# Patient Record
Sex: Male | Born: 1970
Health system: Southern US, Community
[De-identification: ages and names within clinical notes are randomized; demographics above are authoritative.]

## PROBLEM LIST (undated history)

## (undated) DIAGNOSIS — I499 Cardiac arrhythmia, unspecified: Secondary | ICD-10-CM

## (undated) DIAGNOSIS — R011 Cardiac murmur, unspecified: Secondary | ICD-10-CM

## (undated) DIAGNOSIS — J45990 Exercise induced bronchospasm: Secondary | ICD-10-CM

## (undated) DIAGNOSIS — I340 Nonrheumatic mitral (valve) insufficiency: Secondary | ICD-10-CM

## (undated) DIAGNOSIS — E785 Hyperlipidemia, unspecified: Secondary | ICD-10-CM

## (undated) DIAGNOSIS — I341 Nonrheumatic mitral (valve) prolapse: Secondary | ICD-10-CM

## (undated) DIAGNOSIS — T7840XA Allergy, unspecified, initial encounter: Secondary | ICD-10-CM

## (undated) HISTORY — DX: Allergy, unspecified, initial encounter: T78.40XA

## (undated) HISTORY — DX: Cardiac murmur, unspecified: R01.1

## (undated) HISTORY — PX: VASECTOMY: SHX75

## (undated) HISTORY — PX: CARDIAC VALVE REPLACEMENT: SHX585

## (undated) HISTORY — PX: POLYPECTOMY: SHX149

## (undated) HISTORY — DX: Nonrheumatic mitral (valve) insufficiency: I34.0

## (undated) HISTORY — PX: COLONOSCOPY: SHX174

## (undated) HISTORY — DX: Nonrheumatic mitral (valve) prolapse: I34.1

## (undated) HISTORY — DX: Cardiac arrhythmia, unspecified: I49.9

## (undated) HISTORY — DX: Hyperlipidemia, unspecified: E78.5

---

## 2019-07-14 ENCOUNTER — Encounter: Payer: Self-pay | Admitting: Registered Nurse

## 2019-07-14 ENCOUNTER — Encounter: Payer: Self-pay | Admitting: Gastroenterology

## 2019-07-14 ENCOUNTER — Other Ambulatory Visit: Payer: Self-pay

## 2019-07-14 ENCOUNTER — Ambulatory Visit (INDEPENDENT_AMBULATORY_CARE_PROVIDER_SITE_OTHER): Payer: BC Managed Care – PPO | Admitting: Registered Nurse

## 2019-07-14 VITALS — BP 153/88 | HR 67 | Temp 97.4°F | Ht 65.0 in | Wt 166.4 lb

## 2019-07-14 DIAGNOSIS — Z0001 Encounter for general adult medical examination with abnormal findings: Secondary | ICD-10-CM | POA: Diagnosis not present

## 2019-07-14 DIAGNOSIS — Z1211 Encounter for screening for malignant neoplasm of colon: Secondary | ICD-10-CM

## 2019-07-14 DIAGNOSIS — Z7689 Persons encountering health services in other specified circumstances: Secondary | ICD-10-CM

## 2019-07-14 DIAGNOSIS — Z130 Encounter for screening for diseases of the blood and blood-forming organs and certain disorders involving the immune mechanism: Secondary | ICD-10-CM

## 2019-07-14 DIAGNOSIS — Z23 Encounter for immunization: Secondary | ICD-10-CM | POA: Diagnosis not present

## 2019-07-14 DIAGNOSIS — Z13228 Encounter for screening for other metabolic disorders: Secondary | ICD-10-CM

## 2019-07-14 DIAGNOSIS — Z13220 Encounter for screening for lipoid disorders: Secondary | ICD-10-CM | POA: Diagnosis not present

## 2019-07-14 DIAGNOSIS — Z1329 Encounter for screening for other suspected endocrine disorder: Secondary | ICD-10-CM | POA: Diagnosis not present

## 2019-07-14 DIAGNOSIS — Z0000 Encounter for general adult medical examination without abnormal findings: Secondary | ICD-10-CM

## 2019-07-14 DIAGNOSIS — I341 Nonrheumatic mitral (valve) prolapse: Secondary | ICD-10-CM | POA: Diagnosis not present

## 2019-07-14 DIAGNOSIS — R062 Wheezing: Secondary | ICD-10-CM | POA: Diagnosis not present

## 2019-07-14 MED ORDER — ALBUTEROL SULFATE HFA 108 (90 BASE) MCG/ACT IN AERS: 2.0000 | INHALATION_SPRAY | Freq: Four times a day (QID) | RESPIRATORY_TRACT | 1 refills | Status: DC | PRN

## 2019-07-14 NOTE — Patient Instructions (Signed)
° ° ° °  If you have lab work done today you will be contacted with your lab results within the next 2 weeks.  If you have not heard from us then please contact us. The fastest way to get your results is to register for My Chart. ° ° °IF you received an x-ray today, you will receive an invoice from [facility]. Please contact [facility] at [phone] with questions or concerns regarding your invoice.  ° °IF you received labwork today, you will receive an invoice from [facility]. Please contact [facility] at [phone] with questions or concerns regarding your invoice.  ° °Our billing staff will not be able to assist you with questions regarding bills from these companies. ° °You will be contacted with the lab results as soon as they are available. The fastest way to get your results is to activate your My Chart account. Instructions are located on the last page of this paperwork. If you have not heard from us regarding the results in 2 weeks, please contact this office. °  ° ° ° °

## 2019-07-14 NOTE — Progress Notes (Signed)
New Patient Office Visit  Subjective:  Patient ID: Kevin Medina, male    DOB: 14-Nov-1970  Age: 49 y.o. MRN: [id number]  CC:  Chief Complaint  Patient presents with  . New Patient (Initial Visit)    physical    HPI Kevin Medina presents for CPE and labs  States that he had COVID in Oct 2020, for the most part symptoms resolved but he has been getting shob when warming up for work outs over the past week - relieved with rest. Notes that this normally does not happen to him. No LOC, no chest pain or difficulty breathing. Rest of workout goes fine.   Due for TDAP and colonoscopy.  Hx of mitral valve prolapse. No symptoms. Will refer to cards for assessment. Last checked 10-15 years ago  Past Medical History:  Diagnosis Date  . Allergy   . Arrhythmia   . Mitral valve prolapse     Past Surgical History:  Procedure Laterality Date  . VASECTOMY      Family History  Problem Relation Age of Onset  . Cancer Mother   . Healthy Mother   . Hyperlipidemia Father   . Hypertension Father   . Healthy Sister   . Cancer Maternal Grandmother   . Stroke Maternal Grandfather   . Hyperlipidemia Paternal Grandfather     Social History   Socioeconomic History  . Marital status: Unknown    Spouse name: Not on file  . Number of children: Not on file  . Years of education: Not on file  . Highest education level: Not on file  Occupational History  . Not on file  Tobacco Use  . Smoking status: Never Smoker  . Smokeless tobacco: Never Used  Substance and Sexual Activity  . Alcohol use: Yes    Alcohol/week: 10.0 standard drinks    Types: 10 Standard drinks or equivalent per week  . Drug use: Never  . Sexual activity: Yes  Other Topics Concern  . Not on file  Social History Narrative  . Not on file   Social Determinants of Health   Financial Resource Strain:   . Difficulty of Paying Living Expenses: Not on file  Food Insecurity:   . Worried About Charity fundraiser in  the Last Year: Not on file  . Ran Out of Food in the Last Year: Not on file  Transportation Needs:   . Lack of Transportation (Medical): Not on file  . Lack of Transportation (Non-Medical): Not on file  Physical Activity:   . Days of Exercise per Week: Not on file  . Minutes of Exercise per Session: Not on file  Stress:   . Feeling of Stress : Not on file  Social Connections:   . Frequency of Communication with Friends and Family: Not on file  . Frequency of Social Gatherings with Friends and Family: Not on file  . Attends Religious Services: Not on file  . Active Member of Clubs or Organizations: Not on file  . Attends Archivist Meetings: Not on file  . Marital Status: Not on file  Intimate Partner Violence:   . Fear of Current or Ex-Partner: Not on file  . Emotionally Abused: Not on file  . Physically Abused: Not on file  . Sexually Abused: Not on file    ROS Review of Systems  Constitutional: Negative.   HENT: Negative.   Eyes: Negative.   Respiratory: Negative.   Cardiovascular: Negative.   Gastrointestinal: Negative.   Endocrine: Negative.  Genitourinary: Negative.   Musculoskeletal: Negative.   Skin: Negative.   Allergic/Immunologic: Negative.   Neurological: Negative.   Hematological: Negative.   Psychiatric/Behavioral: Negative.   All other systems reviewed and are negative.   Objective:   Today's Vitals: BP (!) 153/88   Pulse 67   Temp (!) 97.4 F (36.3 C) (Temporal)   Ht 5\' 5"  (1.651 m)   Wt 166 lb 6.4 oz (75.5 kg)   SpO2 99%   BMI 27.69 kg/m   Physical Exam Vitals and nursing note reviewed.  Constitutional:      General: He is not in acute distress.    Appearance: Normal appearance. He is normal weight. He is not ill-appearing, toxic-appearing or diaphoretic.  HENT:     Head: Normocephalic and atraumatic.     Right Ear: Tympanic membrane, ear canal and external ear normal. There is no impacted cerumen.     Left Ear: Tympanic  membrane, ear canal and external ear normal. There is no impacted cerumen.     Nose: Nose normal. No congestion or rhinorrhea.     Mouth/Throat:     Mouth: Mucous membranes are moist.     Pharynx: Oropharynx is clear. No oropharyngeal exudate or posterior oropharyngeal erythema.  Eyes:     General: No scleral icterus.       Right eye: No discharge.        Left eye: No discharge.     Extraocular Movements: Extraocular movements intact.     Conjunctiva/sclera: Conjunctivae normal.     Pupils: Pupils are equal, round, and reactive to light.  Neck:     Vascular: No carotid bruit.  Cardiovascular:     Rate and Rhythm: Normal rate and regular rhythm.     Pulses: Normal pulses.     Heart sounds: Murmur present. No friction rub. No gallop.   Pulmonary:     Effort: Pulmonary effort is normal. No respiratory distress.     Breath sounds: No stridor. Wheezing (mild, R side) present. No rhonchi or rales.  Chest:     Chest wall: No tenderness.  Abdominal:     General: Abdomen is flat. Bowel sounds are normal. There is no distension.     Palpations: Abdomen is soft. There is no mass.     Tenderness: There is no abdominal tenderness. There is no right CVA tenderness, left CVA tenderness, guarding or rebound.     Hernia: No hernia is present.  Musculoskeletal:        General: No swelling, tenderness, deformity or signs of injury. Normal range of motion.     Cervical back: Normal range of motion and neck supple. No rigidity or tenderness.     Right lower leg: No edema.     Left lower leg: No edema.  Lymphadenopathy:     Cervical: No cervical adenopathy.  Skin:    General: Skin is warm and dry.     Capillary Refill: Capillary refill takes less than 2 seconds.     Coloration: Skin is not jaundiced or pale.     Findings: No bruising, erythema, lesion or rash.  Neurological:     General: No focal deficit present.     Mental Status: He is alert and oriented to person, place, and time. Mental  status is at baseline.     Cranial Nerves: No cranial nerve deficit.     Sensory: No sensory deficit.     Motor: No weakness.     Coordination: Coordination normal.  Gait: Gait normal.     Deep Tendon Reflexes: Reflexes normal.  Psychiatric:        Mood and Affect: Mood normal.        Behavior: Behavior normal.        Thought Content: Thought content normal.        Judgment: Judgment normal.     Assessment & Plan:   Problem List Items Addressed This Visit    None    Visit Diagnoses    Screening for colon cancer    -  Primary   Relevant Orders   Ambulatory referral to Gastroenterology   Screening for endocrine, metabolic and immunity disorder       Relevant Orders   CBC   Comprehensive metabolic panel   TSH   Hemoglobin A1c   Lipid screening       Relevant Orders   Lipid Panel   Need for diphtheria-tetanus-pertussis (Tdap) vaccine       Relevant Orders   Tdap vaccine greater than or equal to 7yo IM (Completed)      No outpatient encounter medications on file as of 07/14/2019.   No facility-administered encounter medications on file as of 07/14/2019.    Follow-up: No follow-ups on file.   PLAN  Refer to cardiology  Refer for colonoscopy  Labs drawn, will follow up as warranted  Mitral valve prolapse murmur notable on exam, likely grade 2 or 3, no palpable thrill.   Mild wheezing throughout middle and lower lobes of R lung - improved with deep breathing. Will give albuterol for PRN use, before workouts, etc  Otherwise normal findings on exam. Endorses healthy lifestyle  Patient encouraged to call clinic with any questions, comments, or concerns.  Maximiano Coss, NP

## 2019-07-15 LAB — COMPREHENSIVE METABOLIC PANEL
ALT: 36 IU/L (ref 0–44)
AST: 34 IU/L (ref 0–40)
Albumin/Globulin Ratio: 2.1 (ref 1.2–2.2)
Albumin: 4.6 g/dL (ref 4.0–5.0)
Alkaline Phosphatase: 50 IU/L (ref 39–117)
BUN/Creatinine Ratio: 11 (ref 9–20)
BUN: 15 mg/dL (ref 6–24)
Bilirubin Total: 0.8 mg/dL (ref 0.0–1.2)
CO2: 26 mmol/L (ref 20–29)
Calcium: 9.8 mg/dL (ref 8.7–10.2)
Chloride: 101 mmol/L (ref 96–106)
Creatinine, Ser: 1.34 mg/dL — ABNORMAL HIGH (ref 0.76–1.27)
GFR calc Af Amer: 71 mL/min/{1.73_m2} (ref >59)
GFR calc non Af Amer: 61 mL/min/{1.73_m2} (ref >59)
Globulin, Total: 2.2 g/dL (ref 1.5–4.5)
Glucose: 79 mg/dL (ref 65–99)
Potassium: 4 mmol/L (ref 3.5–5.2)
Sodium: 142 mmol/L (ref 134–144)
Total Protein: 6.8 g/dL (ref 6.0–8.5)

## 2019-07-15 LAB — CBC
Hematocrit: 46.4 % (ref 37.5–51.0)
Hemoglobin: 15.4 g/dL (ref 13.0–17.7)
MCH: 30.9 pg (ref 26.6–33.0)
MCHC: 33.2 g/dL (ref 31.5–35.7)
MCV: 93 fL (ref 79–97)
Platelets: 255 10*3/uL (ref 150–450)
RBC: 4.98 x10E6/uL (ref 4.14–5.80)
RDW: 12.9 % (ref 11.6–15.4)
WBC: 5.2 10*3/uL (ref 3.4–10.8)

## 2019-07-15 LAB — TSH: TSH: 3.5 u[IU]/mL (ref 0.450–4.500)

## 2019-07-15 LAB — LIPID PANEL
Chol/HDL Ratio: 3.8 ratio (ref 0.0–5.0)
Cholesterol, Total: 245 mg/dL — ABNORMAL HIGH (ref 100–199)
HDL: 65 mg/dL (ref >39)
LDL Chol Calc (NIH): 170 mg/dL — ABNORMAL HIGH (ref 0–99)
Triglycerides: 60 mg/dL (ref 0–149)
VLDL Cholesterol Cal: 10 mg/dL (ref 5–40)

## 2019-07-15 LAB — HEMOGLOBIN A1C
Est. average glucose Bld gHb Est-mCnc: 103 mg/dL
Hgb A1c MFr Bld: 5.2 % (ref 4.8–5.6)

## 2019-07-17 ENCOUNTER — Encounter: Payer: Self-pay | Admitting: Registered Nurse

## 2019-07-17 ENCOUNTER — Telehealth: Payer: Self-pay | Admitting: Registered Nurse

## 2019-07-17 ENCOUNTER — Other Ambulatory Visit: Payer: Self-pay | Admitting: Registered Nurse

## 2019-07-17 DIAGNOSIS — E785 Hyperlipidemia, unspecified: Secondary | ICD-10-CM

## 2019-07-17 MED ORDER — ATORVASTATIN CALCIUM 40 MG PO TABS: 40.0000 mg | ORAL_TABLET | Freq: Every day | ORAL | 3 refills | Status: DC

## 2019-07-17 NOTE — Progress Notes (Signed)
LDL and total cholesterol elevated Will start atorvastatin 40mg  PO qd with dinner. Called patient lvm asking him to return call. Ok to discuss these results with patient Will recheck labs at next physical. Follow up with cardiology and GI as we discussed Letter mailed to patient  Kathrin Ruddy, NP

## 2019-07-17 NOTE — Progress Notes (Signed)
Called pt, lvm asking to return call at 1250 on 07/17/19.  If we could try him again tomorrow, that would be great. His cholesterol is high and I want him to start on atorvastatin 40mg  with dinner each night. There are details in a letter that is posted to his chart - it has been mailed to him as well.  Thank you,  Kathrin Ruddy, NP

## 2019-07-17 NOTE — Telephone Encounter (Signed)
Left a msg on machine to give [place] a return call on which results he is referring to

## 2019-07-17 NOTE — Telephone Encounter (Signed)
Pt called and would like a callback in regards to his results . Not sure pt did not disclose which results

## 2019-07-18 NOTE — Telephone Encounter (Signed)
Patient

## 2019-07-18 NOTE — Telephone Encounter (Signed)
Patient is calling back to receive his cholesterol results. Patient is unable to return calls during office hours. Patient will activate his MyChart and would like to know if his results could be placed in his mychart account. Please advise Cb- [phone]

## 2019-07-18 NOTE — Telephone Encounter (Signed)
Left another msg for patient to return call. If is ok to leave results on this phone number then let us know when return call and we will be able to leave results at that time. Results can also be found on his my chart

## 2019-07-20 ENCOUNTER — Encounter: Payer: Self-pay | Admitting: Radiology

## 2019-07-21 ENCOUNTER — Other Ambulatory Visit: Payer: Self-pay

## 2019-07-21 ENCOUNTER — Ambulatory Visit (INDEPENDENT_AMBULATORY_CARE_PROVIDER_SITE_OTHER): Payer: BC Managed Care – PPO | Admitting: Cardiovascular Disease

## 2019-07-21 ENCOUNTER — Encounter: Payer: Self-pay | Admitting: Cardiovascular Disease

## 2019-07-21 VITALS — BP 130/78 | HR 68 | Temp 97.9°F | Ht 65.0 in | Wt 172.2 lb

## 2019-07-21 DIAGNOSIS — I341 Nonrheumatic mitral (valve) prolapse: Secondary | ICD-10-CM | POA: Diagnosis not present

## 2019-07-21 NOTE — Progress Notes (Signed)
07/21/2019 Kevin Medina   04-27-1971  [id number]  Primary Physician Maximiano Coss, NP Primary Cardiologist: Lorretta Harp MD Renae Gloss  HPI:  Kevin Medina is a 49 y.o. thin and fit appearing married Caucasian male father of one 61 year old daughter who works as an Systems developer.  He was referred by Maximiano Coss, NP for evaluation of dyspnea and mitral valve prolapse.  He basically has no cardiac risk factors.  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  He did have a 2D echo and GXT back in [place] 15 years ago and was told he had mitral valve prolapse.  He was a Wellsite geologist at the time.  He contracted COVID-19 in late October with fever for a day and a half, body aches, headaches and loss of taste and smell for 6 days but recovered.  He is noticed recently when he works out that he is more fatigued and shortness of breath than he usually is.  His PCP noticed a late systolic click and murmur.   Current Meds  Medication Sig  . albuterol (VENTOLIN HFA) 108 (90 Base) MCG/ACT inhaler Inhale 2 puffs into the lungs every 6 (six) hours as needed for wheezing or shortness of breath.  . [DISCONTINUED] atorvastatin (LIPITOR) 40 MG tablet Take 1 tablet (40 mg total) by mouth daily.     No Known Allergies  Social History   Socioeconomic History  . Marital status: Married    Spouse name: Not on file  . Number of children: 1  . Years of education: Not on file  . Highest education level: Not on file  Occupational History  . Not on file  Tobacco Use  . Smoking status: Never Smoker  . Smokeless tobacco: Never Used  Substance and Sexual Activity  . Alcohol use: Yes    Alcohol/week: 10.0 standard drinks    Types: 10 Standard drinks or equivalent per week  . Drug use: Never  . Sexual activity: Yes  Other Topics Concern  . Not on file  Social History Narrative  . Not on file   Social Determinants of Health   Financial Resource Strain:   . Difficulty of  Paying Living Expenses: Not on file  Food Insecurity:   . Worried About Charity fundraiser in the Last Year: Not on file  . Ran Out of Food in the Last Year: Not on file  Transportation Needs:   . Lack of Transportation (Medical): Not on file  . Lack of Transportation (Non-Medical): Not on file  Physical Activity:   . Days of Exercise per Week: Not on file  . Minutes of Exercise per Session: Not on file  Stress:   . Feeling of Stress : Not on file  Social Connections:   . Frequency of Communication with Friends and Family: Not on file  . Frequency of Social Gatherings with Friends and Family: Not on file  . Attends Religious Services: Not on file  . Active Member of Clubs or Organizations: Not on file  . Attends Archivist Meetings: Not on file  . Marital Status: Not on file  Intimate Partner Violence:   . Fear of Current or Ex-Partner: Not on file  . Emotionally Abused: Not on file  . Physically Abused: Not on file  . Sexually Abused: Not on file     Review of Systems: General: negative for chills, fever, night sweats or weight changes.  Cardiovascular: negative for chest pain, dyspnea on  exertion, edema, orthopnea, palpitations, paroxysmal nocturnal dyspnea or shortness of breath Dermatological: negative for rash Respiratory: negative for cough or wheezing Urologic: negative for hematuria Abdominal: negative for nausea, vomiting, diarrhea, bright red blood per rectum, melena, or hematemesis Neurologic: negative for visual changes, syncope, or dizziness All other systems reviewed and are otherwise negative except as noted above.    Blood pressure 130/78, pulse 68, temperature 97.9 F (36.6 C), height 5\' 5"  (1.651 m), weight 172 lb 3.2 oz (78.1 kg), SpO2 99 %.  General appearance: alert and no distress Neck: no adenopathy, no carotid bruit, no JVD, supple, symmetrical, trachea midline and thyroid not enlarged, symmetric, no tenderness/mass/nodules Lungs: clear to  auscultation bilaterally Heart: Late systolic click and apical systolic murmur consistent with mitral valve prolapse Extremities: extremities normal, atraumatic, no cyanosis or edema Pulses: 2+ and symmetric Skin: Skin color, texture, turgor normal. No rashes or lesions Neurologic: Alert and oriented X 3, normal strength and tone. Normal symmetric reflexes. Normal coordination and gait  EKG sinus rhythm at 73 without ST or T wave changes.  I personally reviewed this EKG.  ASSESSMENT AND PLAN:   Mitral valve prolapse History of mitral valve prolapse but to 2D echo 15 years ago.  He does have a late systolic click and a soft apical systolic murmur on exam.  He also complains of fairly recent onset dyspnea.  He is placed on albuterol with mild benefit.  Ankle negative 2D echo to further evaluate.      Lorretta Harp MD FACP,FACC,FAHA, [facility] 07/21/2019 11:23 AM

## 2019-07-21 NOTE — Assessment & Plan Note (Signed)
History of mitral valve prolapse but to 2D echo 15 years ago.  He does have a late systolic click and a soft apical systolic murmur on exam.  He also complains of fairly recent onset dyspnea.  He is placed on albuterol with mild benefit.  Ankle negative 2D echo to further evaluate.

## 2019-07-21 NOTE — Patient Instructions (Signed)
Medication Instructions:  Your physician recommends that you continue on your current medications as directed. Please refer to the Current Medication list given to you today.  If you need a refill on your cardiac medications before your next appointment, please call your pharmacy.   Lab work: NONE  Testing/Procedures: Your physician has requested that you have an echocardiogram. Echocardiography is a painless test that uses sound waves to create images of your heart. It provides your doctor with information about the size and shape of your heart and how well your heart's chambers and valves are working. This procedure takes approximately one hour. There are no restrictions for this procedure. [address]  Follow-Up: At [facility], you and your health needs are our priority.  As part of our continuing mission to provide you with exceptional heart care, we have created designated Provider Care Teams.  These Care Teams include your primary Cardiologist (physician) and Advanced Practice Providers (APPs -  Physician Assistants and Nurse Practitioners) who all work together to provide you with the care you need, when you need it. You may see Dr. Berry or one of the following Advanced Practice Providers on your designated Care Team:    Luke Kilroy, PA-C  Callie Goodrich, PA-C  Jesse Cleaver, FNP  Your physician wants you to follow-up as needed   

## 2019-08-04 ENCOUNTER — Ambulatory Visit (AMBULATORY_SURGERY_CENTER): Payer: BC Managed Care – PPO | Admitting: *Deleted

## 2019-08-04 ENCOUNTER — Other Ambulatory Visit: Payer: Self-pay

## 2019-08-04 ENCOUNTER — Telehealth: Payer: Self-pay | Admitting: *Deleted

## 2019-08-04 VITALS — Temp 98.2°F | Ht 65.0 in | Wt 168.0 lb

## 2019-08-04 DIAGNOSIS — Z1211 Encounter for screening for malignant neoplasm of colon: Secondary | ICD-10-CM

## 2019-08-04 DIAGNOSIS — Z01818 Encounter for other preprocedural examination: Secondary | ICD-10-CM

## 2019-08-04 NOTE — Telephone Encounter (Signed)
Kevin Medina, Just read the cardiology note by Dr. Gwenlyn Found and since he ordered it I would recommend that we complete the echocardiogram and have him scheduled after.  Lets just make sure everything is okay.  That would be my recommendation.  We should be able to get him in pretty soon thereafter.  We just want to do it safe for him. Thanks. GM

## 2019-08-04 NOTE — Telephone Encounter (Signed)
Called pt to give recommendation per Dr  Rush Landmark - [facility]

## 2019-08-04 NOTE — Progress Notes (Signed)
No egg or soy allergy known to patient  No issues with past sedation with any surgeries  or procedures, no  Past intubation  No diet pills per patient No home 02 use per patient  No blood thinners per patient  Pt denies issues with constipation  No A fib or A flutter  EMMI video sent to pt's e mail   Due to the COVID-19 pandemic we are asking patients to follow these guidelines. Please only bring one care partner. Please be aware that your care partner may wait in the car in the parking lot or if they feel like they will be too hot to wait in the car, they may wait in the lobby on the 4th floor. All care partners are required to wear a mask the entire time (we do not have any that we can provide them), they need to practice social distancing, and we will do a Covid check for all patient's and care partners when you arrive. Also we will check their temperature and your temperature. If the care partner waits in their car they need to stay in the parking lot the entire time and we will call them on their cell phone when the patient is ready for discharge so they can bring the car to the front of the building. Also all patient's will need to wear a mask into building.

## 2019-08-04 NOTE — Telephone Encounter (Signed)
Marie,  This pt is cleared for anesthetic care at [facility].  Thanks,  Eberardo Demello 

## 2019-08-04 NOTE — Telephone Encounter (Signed)
Dr Rush Landmark and Osvaldo Angst,  I saw this pt in [place] this morning- he informed me he has an Echo scheduled for 3-19 for a MVP- he has a colon scheduled for 08-18-2019, screening. He also told me about 15 yrs ago he had a colonoscopy and he had polyps- he is unaware of type and we have no records- he also said he had an Echo about the same time that was normal.  He saw Cardio 07-21-2019.   He is very healthy, works out about 6 days a week- he had covid 03-2019 and now has some dyspnea with working out as well  Should he RS his colon until after his Echo, or is he okay to proceed?  Thanks for your time, Marijean Niemann

## 2019-08-07 NOTE — Telephone Encounter (Signed)
Spoke to pt- informed him of Dr Donneta Romberg recommendation- canceled 3-5 colon, 3-2 cov test- ot will CB to RS colon after 3-19 echo and once cleared by cardiology

## 2019-08-07 NOTE — Telephone Encounter (Signed)
LM to [facility] to me in [place]

## 2019-08-18 ENCOUNTER — Other Ambulatory Visit (HOSPITAL_COMMUNITY): Payer: BC Managed Care – PPO

## 2019-08-18 ENCOUNTER — Encounter: Payer: BC Managed Care – PPO | Admitting: Gastroenterology

## 2019-09-01 ENCOUNTER — Encounter (INDEPENDENT_AMBULATORY_CARE_PROVIDER_SITE_OTHER): Payer: Self-pay

## 2019-09-01 ENCOUNTER — Other Ambulatory Visit: Payer: Self-pay

## 2019-09-01 ENCOUNTER — Ambulatory Visit (HOSPITAL_COMMUNITY): Payer: BC Managed Care – PPO | Attending: Internal Medicine

## 2019-09-01 DIAGNOSIS — I341 Nonrheumatic mitral (valve) prolapse: Secondary | ICD-10-CM | POA: Diagnosis not present

## 2019-09-05 ENCOUNTER — Other Ambulatory Visit: Payer: Self-pay

## 2019-09-05 DIAGNOSIS — U071 COVID-19: Secondary | ICD-10-CM

## 2019-09-05 DIAGNOSIS — R0600 Dyspnea, unspecified: Secondary | ICD-10-CM

## 2019-09-05 DIAGNOSIS — I341 Nonrheumatic mitral (valve) prolapse: Secondary | ICD-10-CM

## 2019-09-05 DIAGNOSIS — Z87891 Personal history of nicotine dependence: Secondary | ICD-10-CM

## 2019-09-20 ENCOUNTER — Encounter: Payer: Self-pay | Admitting: *Deleted

## 2019-09-20 ENCOUNTER — Encounter: Payer: Self-pay | Admitting: Cardiovascular Disease

## 2019-09-20 ENCOUNTER — Other Ambulatory Visit: Payer: Self-pay

## 2019-09-20 ENCOUNTER — Ambulatory Visit (INDEPENDENT_AMBULATORY_CARE_PROVIDER_SITE_OTHER): Payer: BC Managed Care – PPO | Admitting: Cardiovascular Disease

## 2019-09-20 VITALS — BP 136/86 | HR 63 | Ht 65.0 in | Wt 167.8 lb

## 2019-09-20 DIAGNOSIS — Z01812 Encounter for preprocedural laboratory examination: Secondary | ICD-10-CM

## 2019-09-20 DIAGNOSIS — I341 Nonrheumatic mitral (valve) prolapse: Secondary | ICD-10-CM

## 2019-09-20 NOTE — Patient Instructions (Addendum)
Your physician recommends that you continue on your current medications as directed. Please refer to the Current Medication list given to you today.   Your physician recommends that you return for lab work in: BMET CBC INR PRIOR TO CATH    Your physician has requested that you have a TEE. During a TEE, sound waves are used to create images of your heart. It provides your doctor with information about the size and shape of your heart and how well your heart's chambers and valves are working. In this test, a transducer is attached to the end of a flexible tube that's guided down your throat and into your esophagus (the tube leading from you mouth to your stomach) to get a more detailed image of your heart. You are not awake for the procedure. Please see the instruction sheet given to you today. For further information please visit [email or web address].     [facility] [place] [address] Dept: [phone] Loc: [phone]  Kevin Medina  09/20/2019  You are scheduled for a Cardiac Catheterization on Thursday, April 22 with Dr. Quay Burow.  1. Please arrive at the [facility] (Main Entrance A) at [facility]: [address] [place], [place] [address] at 9:30 AM (This time is two hours before your procedure to ensure your preparation). Free valet parking service is available.   Special note: Every effort is made to have your procedure done on time. Please understand that emergencies sometimes delay scheduled procedures.  2. Diet: Do not eat solid foods after midnight.  The patient may have clear liquids until 5am upon the day of the procedure.  3. Labs: You will need to have blood drawn on Tuesday, April 20 at [place]  Open: [place] (Lunch 12:30 - 1:30)   Phone: [phone]. You do not need to be fasting.  4. Medication instructions in  preparation for your procedure:   Contrast Allergy: No     On the morning of your procedure, take your Aspirin and any morning medicines NOT listed above.  You may use sips of water.  5. Plan for one night stay--bring personal belongings. 6. Bring a current list of your medications and current insurance cards. 7. You MUST have a responsible person to drive you home. 8. Someone MUST be with you the first 24 hours after you arrive home or your discharge will be delayed. 9. Please wear clothes that are easy to get on and off and wear slip-on shoes.  Thank you for allowing [place] to care for you!   -- [facility]

## 2019-09-20 NOTE — H&P (View-Only) (Signed)
09/20/2019 Kevin Medina   Oct 09, 1970  [id number]  Primary Physician Maximiano Coss, NP Primary Cardiologist: Lorretta Harp MD Renae Gloss  HPI:  Kevin Medina is a 49 y.o.  thin and fit appearing married Caucasian male father of one 4 year old daughter who works as an Systems developer.  He was referred by Maximiano Coss, NP for evaluation of dyspnea and mitral valve prolapse.  I last saw him in the office 07/21/2019. He basically has no cardiac risk factors.  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  He did have a 2D echo and GXT back in [place] 15 years ago and was told he had mitral valve prolapse.  He was a Wellsite geologist at the time.  He contracted COVID-19 in late October with fever for a day and a half, body aches, headaches and loss of taste and smell for 6 days but recovered.  He is noticed recently when he works out that he is more fatigued and shortness of breath than he usually is.  His PCP noticed a late systolic click and murmur.  Since I saw him last he has had a 2D echo performed 09/01/2019 revealed an EF of 40% with normal LV size.  He has grade 2 diastolic dysfunction, bilateral mitral valve prolapse with moderate to severe mitral regurgitation.  He is functionally limited and has noticed increasing shortness of breath on exertion.  He will need right left heart cath, transesophageal echo and referral to Dr. Roxy Manns for consideration of minimally invasive mitral valve repair.    Current Meds  Medication Sig  . albuterol (VENTOLIN HFA) 108 (90 Base) MCG/ACT inhaler Inhale 2 puffs into the lungs every 6 (six) hours as needed for wheezing or shortness of breath.  Marland Kitchen CREATINE PO Take by mouth.  . DIGESTIVE ENZYMES PO Take by mouth.  . FIBER DIET PO Take by mouth.  . Multiple Vitamin (MULTIVITAMIN) tablet Take 1 tablet by mouth daily.  . OMEGA-3 FATTY ACIDS PO Take by mouth.  . TURMERIC PO Take by mouth.     No Known Allergies  Social History    Socioeconomic History  . Marital status: Married    Spouse name: Not on file  . Number of children: 1  . Years of education: Not on file  . Highest education level: Not on file  Occupational History  . Not on file  Tobacco Use  . Smoking status: Former Research scientist (life sciences)  . Smokeless tobacco: Former Network engineer and Sexual Activity  . Alcohol use: Yes    Alcohol/week: 10.0 standard drinks    Types: 10 Standard drinks or equivalent per week    Comment: social- none since jan 08-2019  . Drug use: Never  . Sexual activity: Yes  Other Topics Concern  . Not on file  Social History Narrative  . Not on file   Social Determinants of Health   Financial Resource Strain:   . Difficulty of Paying Living Expenses:   Food Insecurity:   . Worried About Charity fundraiser in the Last Year:   . Arboriculturist in the Last Year:   Transportation Needs:   . Film/video editor (Medical):   Marland Kitchen Lack of Transportation (Non-Medical):   Physical Activity:   . Days of Exercise per Week:   . Minutes of Exercise per Session:   Stress:   . Feeling of Stress :   Social Connections:   . Frequency of Communication with Friends and  Family:   . Frequency of Social Gatherings with Friends and Family:   . Attends Religious Services:   . Active Member of Clubs or Organizations:   . Attends Archivist Meetings:   Marland Kitchen Marital Status:   Intimate Partner Violence:   . Fear of Current or Ex-Partner:   . Emotionally Abused:   Marland Kitchen Physically Abused:   . Sexually Abused:      Review of Systems: General: negative for chills, fever, night sweats or weight changes.  Cardiovascular: negative for chest pain, dyspnea on exertion, edema, orthopnea, palpitations, paroxysmal nocturnal dyspnea or shortness of breath Dermatological: negative for rash Respiratory: negative for cough or wheezing Urologic: negative for hematuria Abdominal: negative for nausea, vomiting, diarrhea, bright red blood per rectum,  melena, or hematemesis Neurologic: negative for visual changes, syncope, or dizziness All other systems reviewed and are otherwise negative except as noted above.    Blood pressure 136/86, pulse 63, height 5\' 5"  (1.651 m), weight 167 lb 12.8 oz (76.1 kg), SpO2 99 %.  General appearance: alert and no distress Neck: no adenopathy, no carotid bruit, no JVD, supple, symmetrical, trachea midline and thyroid not enlarged, symmetric, no tenderness/mass/nodules Lungs: clear to auscultation bilaterally Heart: Soft apical systolic murmur consistent with mitral regurgitation. Extremities: extremities normal, atraumatic, no cyanosis or edema Pulses: 2+ and symmetric Skin: Skin color, texture, turgor normal. No rashes or lesions Neurologic: Alert and oriented X 3, normal strength and tone. Normal symmetric reflexes. Normal coordination and gait  EKG not performed today  ASSESSMENT AND PLAN:   Mitral valve prolapse History of mitral valve prolapse dating back decades.  His last evaluation was 15 years ago.  He is very active and athletic.  He is noticed increasing shortness of breath.  A 2D echocardiogram performed 09/01/2019 showed EF of 40% with bileaflet prolapse and moderate MR.  I am going to schedule him for a TEE, right left heart cath (radial/brachial) after which I will refer him to Dr. Lilly Cove for surgical consultation for minimally invasive mitral valve repair.      Lorretta Harp MD FACP,FACC,FAHA, [facility] 09/20/2019 12:08 PM

## 2019-09-20 NOTE — Assessment & Plan Note (Signed)
History of mitral valve prolapse dating back decades.  His last evaluation was 15 years ago.  He is very active and athletic.  He is noticed increasing shortness of breath.  A 2D echocardiogram performed 09/01/2019 showed EF of 40% with bileaflet prolapse and moderate MR.  I am going to schedule him for a TEE, right left heart cath (radial/brachial) after which I will refer him to Dr. Lilly Cove for surgical consultation for minimally invasive mitral valve repair.

## 2019-09-20 NOTE — Progress Notes (Signed)
09/20/2019 Kevin Medina   May 02, 1971  [id number]  Primary Physician Maximiano Coss, NP Primary Cardiologist: Lorretta Harp MD Renae Gloss  HPI:  Kevin Medina is a 49 y.o.  thin and fit appearing married Caucasian male father of one 34 year old daughter who works as an Systems developer.  He was referred by Maximiano Coss, NP for evaluation of dyspnea and mitral valve prolapse.  I last saw him in the office 07/21/2019. He basically has no cardiac risk factors.  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  He did have a 2D echo and GXT back in [place] 15 years ago and was told he had mitral valve prolapse.  He was a Wellsite geologist at the time.  He contracted COVID-19 in late October with fever for a day and a half, body aches, headaches and loss of taste and smell for 6 days but recovered.  He is noticed recently when he works out that he is more fatigued and shortness of breath than he usually is.  His PCP noticed a late systolic click and murmur.  Since I saw him last he has had a 2D echo performed 09/01/2019 revealed an EF of 40% with normal LV size.  He has grade 2 diastolic dysfunction, bilateral mitral valve prolapse with moderate to severe mitral regurgitation.  He is functionally limited and has noticed increasing shortness of breath on exertion.  He will need right left heart cath, transesophageal echo and referral to Dr. Roxy Manns for consideration of minimally invasive mitral valve repair.    Current Meds  Medication Sig  . albuterol (VENTOLIN HFA) 108 (90 Base) MCG/ACT inhaler Inhale 2 puffs into the lungs every 6 (six) hours as needed for wheezing or shortness of breath.  Marland Kitchen CREATINE PO Take by mouth.  . DIGESTIVE ENZYMES PO Take by mouth.  . FIBER DIET PO Take by mouth.  . Multiple Vitamin (MULTIVITAMIN) tablet Take 1 tablet by mouth daily.  . OMEGA-3 FATTY ACIDS PO Take by mouth.  . TURMERIC PO Take by mouth.     No Known Allergies  Social History    Socioeconomic History  . Marital status: Married    Spouse name: Not on file  . Number of children: 1  . Years of education: Not on file  . Highest education level: Not on file  Occupational History  . Not on file  Tobacco Use  . Smoking status: Former Research scientist (life sciences)  . Smokeless tobacco: Former Network engineer and Sexual Activity  . Alcohol use: Yes    Alcohol/week: 10.0 standard drinks    Types: 10 Standard drinks or equivalent per week    Comment: social- none since jan 08-2019  . Drug use: Never  . Sexual activity: Yes  Other Topics Concern  . Not on file  Social History Narrative  . Not on file   Social Determinants of Health   Financial Resource Strain:   . Difficulty of Paying Living Expenses:   Food Insecurity:   . Worried About Charity fundraiser in the Last Year:   . Arboriculturist in the Last Year:   Transportation Needs:   . Film/video editor (Medical):   Marland Kitchen Lack of Transportation (Non-Medical):   Physical Activity:   . Days of Exercise per Week:   . Minutes of Exercise per Session:   Stress:   . Feeling of Stress :   Social Connections:   . Frequency of Communication with Friends and  Family:   . Frequency of Social Gatherings with Friends and Family:   . Attends Religious Services:   . Active Member of Clubs or Organizations:   . Attends Archivist Meetings:   Marland Kitchen Marital Status:   Intimate Partner Violence:   . Fear of Current or Ex-Partner:   . Emotionally Abused:   Marland Kitchen Physically Abused:   . Sexually Abused:      Review of Systems: General: negative for chills, fever, night sweats or weight changes.  Cardiovascular: negative for chest pain, dyspnea on exertion, edema, orthopnea, palpitations, paroxysmal nocturnal dyspnea or shortness of breath Dermatological: negative for rash Respiratory: negative for cough or wheezing Urologic: negative for hematuria Abdominal: negative for nausea, vomiting, diarrhea, bright red blood per rectum,  melena, or hematemesis Neurologic: negative for visual changes, syncope, or dizziness All other systems reviewed and are otherwise negative except as noted above.    Blood pressure 136/86, pulse 63, height 5\' 5"  (1.651 m), weight 167 lb 12.8 oz (76.1 kg), SpO2 99 %.  General appearance: alert and no distress Neck: no adenopathy, no carotid bruit, no JVD, supple, symmetrical, trachea midline and thyroid not enlarged, symmetric, no tenderness/mass/nodules Lungs: clear to auscultation bilaterally Heart: Soft apical systolic murmur consistent with mitral regurgitation. Extremities: extremities normal, atraumatic, no cyanosis or edema Pulses: 2+ and symmetric Skin: Skin color, texture, turgor normal. No rashes or lesions Neurologic: Alert and oriented X 3, normal strength and tone. Normal symmetric reflexes. Normal coordination and gait  EKG not performed today  ASSESSMENT AND PLAN:   Mitral valve prolapse History of mitral valve prolapse dating back decades.  His last evaluation was 15 years ago.  He is very active and athletic.  He is noticed increasing shortness of breath.  A 2D echocardiogram performed 09/01/2019 showed EF of 40% with bileaflet prolapse and moderate MR.  I am going to schedule him for a TEE, right left heart cath (radial/brachial) after which I will refer him to Dr. Lilly Cove for surgical consultation for minimally invasive mitral valve repair.      Lorretta Harp MD FACP,FACC,FAHA, [facility] 09/20/2019 12:08 PM

## 2019-09-29 ENCOUNTER — Other Ambulatory Visit: Payer: Self-pay | Admitting: *Deleted

## 2019-09-29 DIAGNOSIS — I341 Nonrheumatic mitral (valve) prolapse: Secondary | ICD-10-CM

## 2019-09-29 MED ORDER — SODIUM CHLORIDE 0.9% FLUSH: 3.0000 mL | Freq: Two times a day (BID) | INTRAVENOUS | Status: DC

## 2019-10-02 ENCOUNTER — Telehealth: Payer: Self-pay | Admitting: *Deleted

## 2019-10-02 NOTE — Telephone Encounter (Signed)
Left detailed voicemail message, pt should arrive at [facility] Thursday October 05, 2019 8 AM (not 9:30 AM as previously instructed)-TEE 9 AM, R/LHC 11:30 AM.

## 2019-10-02 NOTE — Telephone Encounter (Signed)
Call placed to patient to advise Thursday October 05, 2019 arrival time at hospital for procedures is 8 AM not 9:30 AM as previously instructed--TEE 9 AM and R/LHC 11:30 AM.  LMTCB for patient to discuss arrival time 8 AM, change from previous instruction of 9:30 AM.

## 2019-10-03 ENCOUNTER — Other Ambulatory Visit (HOSPITAL_COMMUNITY)
Admission: RE | Admit: 2019-10-03 | Discharge: 2019-10-03 | Disposition: A | Discharge status: Home / Self Care | Payer: BC Managed Care – PPO | Source: Ambulatory Visit | Attending: Cardiology | Admitting: Cardiology

## 2019-10-03 ENCOUNTER — Other Ambulatory Visit: Payer: Self-pay

## 2019-10-03 DIAGNOSIS — Z01812 Encounter for preprocedural laboratory examination: Secondary | ICD-10-CM | POA: Diagnosis not present

## 2019-10-03 DIAGNOSIS — Z20822 Contact with and (suspected) exposure to covid-19: Secondary | ICD-10-CM | POA: Insufficient documentation

## 2019-10-04 ENCOUNTER — Other Ambulatory Visit: Payer: Self-pay | Admitting: *Deleted

## 2019-10-04 DIAGNOSIS — I341 Nonrheumatic mitral (valve) prolapse: Secondary | ICD-10-CM

## 2019-10-04 LAB — BASIC METABOLIC PANEL
BUN/Creatinine Ratio: 13 (ref 9–20)
BUN: 16 mg/dL (ref 6–24)
CO2: 25 mmol/L (ref 20–29)
Calcium: 9.4 mg/dL (ref 8.7–10.2)
Chloride: 98 mmol/L (ref 96–106)
Creatinine, Ser: 1.23 mg/dL (ref 0.76–1.27)
GFR calc Af Amer: 79 mL/min/{1.73_m2} (ref >59)
GFR calc non Af Amer: 68 mL/min/{1.73_m2} (ref >59)
Glucose: 74 mg/dL (ref 65–99)
Potassium: 4.7 mmol/L (ref 3.5–5.2)
Sodium: 138 mmol/L (ref 134–144)

## 2019-10-04 LAB — CBC
Hematocrit: 43.5 % (ref 37.5–51.0)
Hemoglobin: 15.1 g/dL (ref 13.0–17.7)
MCH: 31.8 pg (ref 26.6–33.0)
MCHC: 34.7 g/dL (ref 31.5–35.7)
MCV: 92 fL (ref 79–97)
Platelets: 234 10*3/uL (ref 150–450)
RBC: 4.75 x10E6/uL (ref 4.14–5.80)
RDW: 12.9 % (ref 11.6–15.4)
WBC: 5 10*3/uL (ref 3.4–10.8)

## 2019-10-04 LAB — SARS CORONAVIRUS 2 (TAT 6-24 HRS): SARS Coronavirus 2: NEGATIVE

## 2019-10-04 LAB — PROTIME-INR
INR: 1.1 (ref 0.9–1.2)
Prothrombin Time: 11.6 s (ref 9.1–12.0)

## 2019-10-05 ENCOUNTER — Encounter (HOSPITAL_COMMUNITY)
Admission: RE | Disposition: A | Discharge status: Home / Self Care | Payer: BC Managed Care – PPO | Source: Home / Self Care | Attending: Cardiology

## 2019-10-05 ENCOUNTER — Ambulatory Visit (HOSPITAL_COMMUNITY): Payer: BC Managed Care – PPO | Admitting: Certified Registered Nurse Anesthetist

## 2019-10-05 ENCOUNTER — Ambulatory Visit (HOSPITAL_COMMUNITY)
Admission: RE | Admit: 2019-10-05 | Discharge: 2019-10-05 | Disposition: A | Discharge status: Home / Self Care | Payer: BC Managed Care – PPO | Source: Ambulatory Visit | Attending: Cardiovascular Disease | Admitting: Cardiovascular Disease

## 2019-10-05 ENCOUNTER — Ambulatory Visit (HOSPITAL_COMMUNITY)
Admission: RE | Admit: 2019-10-05 | Discharge: 2019-10-05 | Disposition: A | Discharge status: Home / Self Care | Payer: BC Managed Care – PPO | Attending: Cardiology | Admitting: Cardiology

## 2019-10-05 ENCOUNTER — Ambulatory Visit (HOSPITAL_COMMUNITY)
Admission: RE | Admit: 2019-10-05 | Payer: BC Managed Care – PPO | Source: Home / Self Care | Admitting: Cardiovascular Disease

## 2019-10-05 ENCOUNTER — Ambulatory Visit (HOSPITAL_BASED_OUTPATIENT_CLINIC_OR_DEPARTMENT_OTHER): Payer: BC Managed Care – PPO

## 2019-10-05 ENCOUNTER — Other Ambulatory Visit: Payer: Self-pay

## 2019-10-05 ENCOUNTER — Encounter (HOSPITAL_COMMUNITY)
Admission: RE | Disposition: A | Discharge status: Home / Self Care | Payer: Self-pay | Source: Ambulatory Visit | Attending: Cardiovascular Disease

## 2019-10-05 ENCOUNTER — Encounter (HOSPITAL_COMMUNITY): Payer: Self-pay | Admitting: Cardiology

## 2019-10-05 DIAGNOSIS — I340 Nonrheumatic mitral (valve) insufficiency: Secondary | ICD-10-CM | POA: Insufficient documentation

## 2019-10-05 DIAGNOSIS — Z87891 Personal history of nicotine dependence: Secondary | ICD-10-CM | POA: Insufficient documentation

## 2019-10-05 DIAGNOSIS — R0600 Dyspnea, unspecified: Secondary | ICD-10-CM | POA: Insufficient documentation

## 2019-10-05 DIAGNOSIS — Z8616 Personal history of COVID-19: Secondary | ICD-10-CM | POA: Insufficient documentation

## 2019-10-05 DIAGNOSIS — I428 Other cardiomyopathies: Secondary | ICD-10-CM | POA: Diagnosis not present

## 2019-10-05 DIAGNOSIS — I341 Nonrheumatic mitral (valve) prolapse: Secondary | ICD-10-CM | POA: Diagnosis present

## 2019-10-05 DIAGNOSIS — E785 Hyperlipidemia, unspecified: Secondary | ICD-10-CM | POA: Diagnosis not present

## 2019-10-05 HISTORY — PX: RIGHT/LEFT HEART CATH AND CORONARY ANGIOGRAPHY: CATH118266

## 2019-10-05 HISTORY — PX: TEE WITHOUT CARDIOVERSION: SHX5443

## 2019-10-05 LAB — POCT I-STAT EG7
Acid-Base Excess: 2 mmol/L (ref 0.0–2.0)
Acid-Base Excess: 3 mmol/L — ABNORMAL HIGH (ref 0.0–2.0)
Acid-Base Excess: 3 mmol/L — ABNORMAL HIGH (ref 0.0–2.0)
Bicarbonate: 26.3 mmol/L (ref 20.0–28.0)
Bicarbonate: 26.6 mmol/L (ref 20.0–28.0)
Bicarbonate: 26.7 mmol/L (ref 20.0–28.0)
Calcium, Ion: 1.21 mmol/L (ref 1.15–1.40)
Calcium, Ion: 1.23 mmol/L (ref 1.15–1.40)
Calcium, Ion: 1.23 mmol/L (ref 1.15–1.40)
HCT: 40 % (ref 39.0–52.0)
HCT: 41 % (ref 39.0–52.0)
HCT: 42 % (ref 39.0–52.0)
Hemoglobin: 13.6 g/dL (ref 13.0–17.0)
Hemoglobin: 13.9 g/dL (ref 13.0–17.0)
Hemoglobin: 14.3 g/dL (ref 13.0–17.0)
O2 Saturation: 100 %
O2 Saturation: 79 %
O2 Saturation: 81 %
Potassium: 3.5 mmol/L (ref 3.5–5.1)
Potassium: 3.6 mmol/L (ref 3.5–5.1)
Potassium: 3.6 mmol/L (ref 3.5–5.1)
Sodium: 140 mmol/L (ref 135–145)
Sodium: 141 mmol/L (ref 135–145)
Sodium: 141 mmol/L (ref 135–145)
TCO2: 27 mmol/L (ref 22–32)
TCO2: 28 mmol/L (ref 22–32)
TCO2: 28 mmol/L (ref 22–32)
pCO2, Ven: 36.6 mmHg — ABNORMAL LOW (ref 44.0–60.0)
pCO2, Ven: 37.2 mmHg — ABNORMAL LOW (ref 44.0–60.0)
pCO2, Ven: 39.1 mmHg — ABNORMAL LOW (ref 44.0–60.0)
pH, Ven: 7.44 — ABNORMAL HIGH (ref 7.250–7.430)
pH, Ven: 7.463 — ABNORMAL HIGH (ref 7.250–7.430)
pH, Ven: 7.465 — ABNORMAL HIGH (ref 7.250–7.430)
pO2, Ven: 213 mmHg — ABNORMAL HIGH (ref 32.0–45.0)
pO2, Ven: 40 mmHg (ref 32.0–45.0)
pO2, Ven: 42 mmHg (ref 32.0–45.0)

## 2019-10-05 SURGERY — ECHOCARDIOGRAM, TRANSESOPHAGEAL
Anesthesia: Monitor Anesthesia Care

## 2019-10-05 SURGERY — RIGHT/LEFT HEART CATH AND CORONARY ANGIOGRAPHY
Anesthesia: LOCAL

## 2019-10-05 MED ORDER — SODIUM CHLORIDE 0.9 % IV SOLN: 250.0000 mL | INTRAVENOUS | Status: DC | PRN

## 2019-10-05 MED ORDER — SODIUM CHLORIDE 0.9 % IV SOLN: INTRAVENOUS | Status: DC

## 2019-10-05 MED ORDER — ACETAMINOPHEN 325 MG PO TABS: 650.0000 mg | ORAL_TABLET | ORAL | Status: DC | PRN

## 2019-10-05 MED ORDER — ONDANSETRON HCL 4 MG/2ML IJ SOLN: 4.0000 mg | Freq: Four times a day (QID) | INTRAMUSCULAR | Status: DC | PRN

## 2019-10-05 MED ORDER — MIDAZOLAM HCL 2 MG/2ML IJ SOLN
INTRAMUSCULAR | Status: DC | PRN
  Administered 2019-10-05: 0.5 mg via INTRAVENOUS

## 2019-10-05 MED ORDER — HYDRALAZINE HCL 20 MG/ML IJ SOLN: 10.0000 mg | INTRAMUSCULAR | Status: DC | PRN

## 2019-10-05 MED ORDER — FENTANYL CITRATE (PF) 100 MCG/2ML IJ SOLN
INTRAMUSCULAR | Status: DC | PRN
  Administered 2019-10-05: 25 ug via INTRAVENOUS

## 2019-10-05 MED ORDER — SODIUM CHLORIDE 0.9% FLUSH: 3.0000 mL | INTRAVENOUS | Status: DC | PRN

## 2019-10-05 MED ORDER — SODIUM CHLORIDE 0.9% FLUSH: 3.0000 mL | Freq: Two times a day (BID) | INTRAVENOUS | Status: DC

## 2019-10-05 MED ORDER — ASPIRIN 81 MG PO CHEW: 81.0000 mg | CHEWABLE_TABLET | ORAL | Status: DC

## 2019-10-05 MED ORDER — LABETALOL HCL 5 MG/ML IV SOLN: 10.0000 mg | INTRAVENOUS | Status: DC | PRN

## 2019-10-05 MED ORDER — MORPHINE SULFATE (PF) 2 MG/ML IV SOLN: 2.0000 mg | INTRAVENOUS | Status: DC | PRN

## 2019-10-05 MED ORDER — LIDOCAINE 2% (20 MG/ML) 5 ML SYRINGE
INTRAMUSCULAR | Status: DC | PRN
  Administered 2019-10-05: 60 mg via INTRAVENOUS

## 2019-10-05 MED ORDER — PROPOFOL 10 MG/ML IV BOLUS
INTRAVENOUS | Status: DC | PRN
  Administered 2019-10-05: 10 mg via INTRAVENOUS
  Administered 2019-10-05 (×2): 20 mg via INTRAVENOUS
  Administered 2019-10-05 (×2): 10 mg via INTRAVENOUS
  Administered 2019-10-05: 20 mg via INTRAVENOUS

## 2019-10-05 MED ORDER — LIDOCAINE HCL (PF) 1 % IJ SOLN
INTRAMUSCULAR | Status: DC | PRN
  Administered 2019-10-05: 2 mL via INTRADERMAL
  Administered 2019-10-05: 15 mL via INTRADERMAL

## 2019-10-05 MED ORDER — HEPARIN (PORCINE) IN NACL 1000-0.9 UT/500ML-% IV SOLN
INTRAVENOUS | Status: DC | PRN
  Administered 2019-10-05 (×2): 500 mL

## 2019-10-05 MED ORDER — PROPOFOL 500 MG/50ML IV EMUL
INTRAVENOUS | Status: DC | PRN
  Administered 2019-10-05: 120 ug/kg/min via INTRAVENOUS

## 2019-10-05 MED ORDER — SODIUM CHLORIDE 0.9 % WEIGHT BASED INFUSION
3.0000 mL/kg/h | INTRAVENOUS | Status: AC
  Administered 2019-10-05: 3 mL/kg/h via INTRAVENOUS

## 2019-10-05 MED ORDER — SODIUM CHLORIDE 0.9 % WEIGHT BASED INFUSION: 1.0000 mL/kg/h | INTRAVENOUS | Status: DC

## 2019-10-05 MED ORDER — IOHEXOL 350 MG/ML SOLN
INTRAVENOUS | Status: DC | PRN
  Administered 2019-10-05: 35 mL via INTRA_ARTERIAL

## 2019-10-05 SURGICAL SUPPLY — 12 items
CATH BALLN WEDGE 5F 110CM (CATHETERS) ×2 IMPLANT
CATH INFINITI 5FR MULTPACK ANG (CATHETERS) ×2 IMPLANT
GLIDESHEATH SLEND A-KIT 6F 22G (SHEATH) ×4 IMPLANT
KIT HEART LEFT (KITS) ×2 IMPLANT
PACK CARDIAC CATHETERIZATION (CUSTOM PROCEDURE TRAY) ×2 IMPLANT
SHEATH GLIDE SLENDER 4/5FR (SHEATH) ×2 IMPLANT
SHEATH PINNACLE 5F 10CM (SHEATH) ×2 IMPLANT
SYR MEDRAD MARK 7 150ML (SYRINGE) ×2 IMPLANT
TRANSDUCER W/STOPCOCK (MISCELLANEOUS) ×2 IMPLANT
TUBING CIL FLEX 10 FLL-RA (TUBING) ×2 IMPLANT
WIRE EMERALD 3MM-J .035X150CM (WIRE) ×2 IMPLANT
WIRE HI TORQ VERSACORE-J 145CM (WIRE) ×2 IMPLANT

## 2019-10-05 NOTE — Discharge Instructions (Signed)
Drink plenty of fluids for 48 hours and keep wrist elevated at heart level for 24 hours  Femoral Site Care This sheet gives you information about how to care for yourself after your procedure. Your health care provider may also give you more specific instructions. If you have problems or questions, contact your health care provider. What can I expect after the procedure?  After the procedure, it is common to have:  Bruising that usually fades within 1-2 weeks.  Tenderness at the site. Follow these instructions at home: Wound care 1. Follow instructions from your health care provider about how to take care of your insertion site. Make sure you: ? Wash your hands with soap and water before you change your bandage (dressing). If soap and water are not available, use hand sanitizer. ? Remove your dressing as told by your health care provider. 2. Do not take baths, swim, or use a hot tub until your health care provider approves. 3. You may shower 24-48 hours after the procedure or as told by your health care provider. ? Gently wash the site with plain soap and water. ? Pat the area dry with a clean towel. ? Do not rub the site. This may cause bleeding. 4. Do not apply powder or lotion to the site. Keep the site clean and dry. 5. Check your femoral site every day for signs of infection. Check for: ? Redness, swelling, or pain. ? Fluid or blood. ? Warmth. ? Pus or a bad smell. Activity 1. For the first 2-3 days after your procedure, or as long as directed: ? Avoid climbing stairs as much as possible. ? Do not squat. 2. Do not lift anything that is heavier than 10 lb (4.5 kg), or the limit that you are told, until your health care provider says that it is safe. For 5 days 3. Rest as directed. ? Avoid sitting for a long time without moving. Get up to take short walks every 1-2 hours. 4. Do not drive for 24 hours if you were given a medicine to help you relax (sedative). General  instructions  Take over-the-counter and prescription medicines only as told by your health care provider.  Keep all follow-up visits as told by your health care provider. This is important. Contact a health care provider if you have:  A fever or chills.  You have redness, swelling, or pain around your insertion site. Get help right away if:  The catheter insertion area swells very fast.  You pass out.  You suddenly start to sweat or your skin gets clammy.  The catheter insertion area is bleeding, and the bleeding does not stop when you hold steady pressure on the area.  The area near or just beyond the catheter insertion site becomes pale, cool, tingly, or numb. These symptoms may represent a serious problem that is an emergency. Do not wait to see if the symptoms will go away. Get medical help right away. Call your local emergency services (911 in the [place] Do not drive yourself to the hospital. Summary  After the procedure, it is common to have bruising that usually fades within 1-2 weeks.  Check your femoral site every day for signs of infection.  Do not lift anything that is heavier than 10 lb (4.5 kg), or the limit that you are told, until your health care provider says that it is safe. This information is not intended to replace advice given to you by your health care provider. Make sure you discuss  any questions you have with your health care provider. Document Revised: 06/14/2017 Document Reviewed: 06/14/2017 Elsevier Patient Education  2020 [place]   This sheet gives you information about how to care for yourself after your procedure. Your health care provider may also give you more specific instructions. If you have problems or questions, contact your health care provider. What can I expect after the procedure? After the procedure, it is common to have:  Bruising and tenderness at the catheter insertion area. Follow these instructions at  home: Medicines  Take over-the-counter and prescription medicines only as told by your health care provider. Insertion site care 6. Follow instructions from your health care provider about how to take care of your insertion site. Make sure you: ? Wash your hands with soap and water before you change your bandage (dressing). If soap and water are not available, use hand sanitizer. ? Remove your dressing as told by your health care provider. In 24 hours 7. Check your insertion site every day for signs of infection. Check for: ? Redness, swelling, or pain. ? Fluid or blood. ? Pus or a bad smell. ? Warmth. 8. Do not take baths, swim, or use a hot tub until your health care provider approves. 9. You may shower 24-48 hours after the procedure, or as directed by your health care provider. ? Remove the dressing and gently wash the site with plain soap and water. ? Pat the area dry with a clean towel. ? Do not rub the site. That could cause bleeding. 10. Do not apply powder or lotion to the site. Activity   5. For 24 hours after the procedure, or as directed by your health care provider: ? Do not flex or bend the affected arm. ? Do not push or pull heavy objects with the affected arm. ? Do not drive yourself home from the hospital or clinic. You may drive 24 hours after the procedure unless your health care provider tells you not to. ? Do not operate machinery or power tools. 6. Do not lift anything that is heavier than 10 lb (4.5 kg), or the limit that you are told, until your health care provider says that it is safe.  For 4 days 7. Ask your health care provider when it is okay to: ? Return to work or school. ? Resume usual physical activities or sports. ? Resume sexual activity. General instructions  If the catheter site starts to bleed, raise your arm and put firm pressure on the site. If the bleeding does not stop, get help right away. This is a medical emergency.  If you went home on  the same day as your procedure, a responsible adult should be with you for the first 24 hours after you arrive home.  Keep all follow-up visits as told by your health care provider. This is important. Contact a health care provider if:  You have a fever.  You have redness, swelling, or yellow drainage around your insertion site. Get help right away if:  You have unusual pain at the radial site.  The catheter insertion area swells very fast.  The insertion area is bleeding, and the bleeding does not stop when you hold steady pressure on the area.  Your arm or hand becomes pale, cool, tingly, or numb. These symptoms may represent a serious problem that is an emergency. Do not wait to see if the symptoms will go away. Get medical help right away. Call your local emergency  services (911 in the [place] Do not drive yourself to the hospital. Summary  After the procedure, it is common to have bruising and tenderness at the site.  Follow instructions from your health care provider about how to take care of your radial site wound. Check the wound every day for signs of infection.  Do not lift anything that is heavier than 10 lb (4.5 kg), or the limit that you are told, until your health care provider says that it is safe. This information is not intended to replace advice given to you by your health care provider. Make sure you discuss any questions you have with your health care provider. Document Revised: 07/07/2017 Document Reviewed: 07/07/2017 Elsevier Patient Education  2020 [employer].

## 2019-10-05 NOTE — Progress Notes (Signed)
D/C instructions reviewed with wife Junie Panning. Verbalized understanding

## 2019-10-05 NOTE — Progress Notes (Signed)
    Transesophageal Echocardiogram Note  Kevin Medina [id number] Sep 09, 1970  Procedure: Transesophageal Echocardiogram Indications: Mitral regurgitation  Procedure Details Consent: Obtained Time Out: Verified patient identification, verified procedure, site/side was marked, verified correct patient position, special equipment/implants available, Radiology Safety Procedures followed,  medications/allergies/relevent history reviewed, required imaging and test results available.  Performed  Medications:  Pt sedated by anesthesia with lidocaine 60 mg and diprovan 260 mg IV total.  Distal anteroseptal and apical hypokinesis with overall mild to moderate LV dysfunction (EF 40-45); bileaflet MVP (posterior > anterior); MR difficult to quantitate as there are 2 jets; likely moderate to severe.    Complications: No apparent complications Patient did tolerate procedure well.  Kirk Ruths, MD

## 2019-10-05 NOTE — Anesthesia Postprocedure Evaluation (Signed)
Anesthesia Post Note  Patient: Kevin Medina  Procedure(s) Performed: TRANSESOPHAGEAL ECHOCARDIOGRAM (TEE) (N/A )     Patient location during evaluation: PACU Anesthesia Type: MAC Level of consciousness: awake and alert Pain management: pain level controlled Vital Signs Assessment: post-procedure vital signs reviewed and stable Respiratory status: spontaneous breathing, nonlabored ventilation, respiratory function stable and patient connected to nasal cannula oxygen Cardiovascular status: stable and blood pressure returned to baseline Postop Assessment: no apparent nausea or vomiting Anesthetic complications: no    Last Vitals:  Vitals:   10/05/19 0928 10/05/19 0938  BP: 127/78 129/74  Pulse: 64 62  Resp: 16 12  Temp:    SpO2: 100% 98%    Last Pain:  Vitals:   10/05/19 0938  TempSrc:   PainSc: 0-No pain                 Effie Berkshire

## 2019-10-05 NOTE — Interval H&P Note (Signed)
History and Physical Interval Note:  10/05/2019 8:00 AM  Kevin Medina  has presented today for surgery, with the diagnosis of mitral valve prolapse.  The various methods of treatment have been discussed with the patient and family. After consideration of risks, benefits and other options for treatment, the patient has consented to  Procedure(s): TRANSESOPHAGEAL ECHOCARDIOGRAM (TEE) (N/A) as a surgical intervention.  The patient's history has been reviewed, patient examined, no change in status, stable for surgery.  I have reviewed the patient's chart and labs.  Questions were answered to the patient's satisfaction.     Kirk Ruths

## 2019-10-05 NOTE — Anesthesia Procedure Notes (Signed)
Procedure Name: MAC Date/Time: 10/05/2019 8:50 AM Performed by: Janene Harvey, CRNA Pre-anesthesia Checklist: Patient identified, Emergency Drugs available, Suction available and Patient being monitored Oxygen Delivery Method: Nasal cannula Placement Confirmation: positive ETCO2 Dental Injury: Teeth and Oropharynx as per pre-operative assessment

## 2019-10-05 NOTE — Anesthesia Preprocedure Evaluation (Addendum)
Anesthesia Evaluation  Patient identified by MRN, date of birth, ID band Patient awake    Reviewed: Allergy & Precautions, Patient's Chart, lab work & pertinent test results  Airway Mallampati: II  TM Distance: >3 FB Neck ROM: Full    Dental  (+) Teeth Intact, Dental Advisory Given   Pulmonary former smoker,    breath sounds clear to auscultation       Cardiovascular + Valvular Problems/Murmurs MVP  Rhythm:Regular Rate:Normal     Neuro/Psych negative neurological ROS  negative psych ROS   GI/Hepatic negative GI ROS, Neg liver ROS,   Endo/Other  negative endocrine ROS  Renal/GU Renal InsufficiencyRenal disease     Musculoskeletal negative musculoskeletal ROS (+)   Abdominal Normal abdominal exam  (+)   Peds  Hematology negative hematology ROS (+)   Anesthesia Other Findings   Reproductive/Obstetrics                            Anesthesia Physical Anesthesia Plan  ASA: II  Anesthesia Plan: MAC   Post-op Pain Management:    Induction: Intravenous  PONV Risk Score and Plan: 0 and Propofol infusion  Airway Management Planned: Natural Airway and Nasal Cannula  Additional Equipment: None  Intra-op Plan:   Post-operative Plan:   Informed Consent: I have reviewed the patients History and Physical, chart, labs and discussed the procedure including the risks, benefits and alternatives for the proposed anesthesia with the patient or authorized representative who has indicated his/her understanding and acceptance.       Plan Discussed with: CRNA  Anesthesia Plan Comments:        Anesthesia Quick Evaluation

## 2019-10-05 NOTE — Progress Notes (Signed)
Sheath Pull Site area- right  Site Prior to Removal- 0   Pressure Applied For-  20 MInutes   Bedrest Beginning at - 1620   Manual- Yes   Patient Status During Pull- Stable    Post Pull Groin Site- 0   Post Pull Instructions Given- Yes   Post Pull Pulses Present- Yes    Dressing Applied- Tegaderm and Gauze Dressing    Comments:

## 2019-10-05 NOTE — Progress Notes (Signed)
*  PRELIMINARY RESULTS* Echocardiogram Echocardiogram Transesophageal has been performed.  Kevin Medina 10/05/2019, 9:29 AM

## 2019-10-05 NOTE — Transfer of Care (Signed)
Immediate Anesthesia Transfer of Care Note  Patient: Kevin Medina  Procedure(s) Performed: TRANSESOPHAGEAL ECHOCARDIOGRAM (TEE) (N/A )  Patient Location: Endoscopy Unit  Anesthesia Type:MAC  Level of Consciousness: drowsy  Airway & Oxygen Therapy: Patient Spontanous Breathing and Patient connected to nasal cannula oxygen  Post-op Assessment: Report given to RN and Post -op Vital signs reviewed and stable  Post vital signs: Reviewed  Last Vitals:  Vitals Value Taken Time  BP 123/75 10/05/19 0919  Temp    Pulse 63 10/05/19 0919  Resp 18 10/05/19 0919  SpO2 100 % 10/05/19 0919  Vitals shown include unvalidated device data.  Last Pain:  Vitals:   10/05/19 0919  TempSrc:   PainSc: 0-No pain         Complications: No apparent anesthesia complications

## 2019-10-05 NOTE — Discharge Instructions (Signed)

## 2019-10-05 NOTE — H&P (Signed)
Office Visit  09/20/2019 [facility] Seymour Bars, MD Cardiology  Pre-procedure lab exam +1 more Dx  Referred by Maximiano Coss, NP Reason for Visit  Additional Documentation  Vitals:   BP 136/86  Pulse 63  Ht 5\' 5"  (1.651 m)  Wt 76.1 kg  SpO2 99%  BMI 27.92 kg/m  BSA 1.87 m  Flowsheets:   NEWS,  MEWS Score,  Anthropometrics,  Method of Visit    Encounter Info:   Billing Info,  History,  Allergies,  Detailed Report    All Notes  Progress Notes by Lorretta Harp, MD at 09/20/2019 11:30 AM Author: Lorretta Harp, MD Author Type: Physician Filed: 09/20/2019 3:18 PM  Note Status: Signed Cosign: Cosign Not Required Encounter Date: 09/20/2019  Editor: Lorretta Harp, MD (Physician)        09/20/2019 Kevin Medina   08/18/1970  [id number]  Primary Physician Maximiano Coss, NP Primary Cardiologist: Lorretta Harp MD Renae Gloss  HPI:  Kevin Medina is a 49 y.o.  thin and fit appearing married Caucasian male father of one 34 year old daughter who works as an Systems developer. He was referred by Maximiano Coss, NP for evaluation of dyspnea and mitral valve prolapse.  I last saw him in the office 07/21/2019.He basically has no cardiac risk factors. 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 He did have a 2D echo and GXT back in [place] 15 years ago and was told he had mitral valve prolapse. He was a Wellsite geologist at the time. He contracted COVID-19 in late October with fever for a day and a half, body aches, headaches and loss of taste and smell for 6 days but recovered. He is noticed recently when he works out that he is more fatigued and shortness of breath than he usually is. His PCP noticed a late systolic click and murmur.  Since I saw him last he has had a 2D echo performed 09/01/2019 revealed an EF of 40% with normal LV size.  He has grade 2 diastolic dysfunction, bilateral mitral valve prolapse with moderate to severe  mitral regurgitation.  He is functionally limited and has noticed increasing shortness of breath on exertion.  He will need right left heart cath, transesophageal echo and referral to Dr. Roxy Manns for consideration of minimally invasive mitral valve repair.        Current Meds  Medication Sig  . albuterol (VENTOLIN HFA) 108 (90 Base) MCG/ACT inhaler Inhale 2 puffs into the lungs every 6 (six) hours as needed for wheezing or shortness of breath.  Marland Kitchen CREATINE PO Take by mouth.  . DIGESTIVE ENZYMES PO Take by mouth.  . FIBER DIET PO Take by mouth.  . Multiple Vitamin (MULTIVITAMIN) tablet Take 1 tablet by mouth daily.  . OMEGA-3 FATTY ACIDS PO Take by mouth.  . TURMERIC PO Take by mouth.     No Known Allergies  Social History        Socioeconomic History  . Marital status: Married    Spouse name: Not on file  . Number of children: 1  . Years of education: Not on file  . Highest education level: Not on file  Occupational History  . Not on file  Tobacco Use  . Smoking status: Former Research scientist (life sciences)  . Smokeless tobacco: Former Network engineer and Sexual Activity  . Alcohol use: Yes    Alcohol/week: 10.0 standard drinks    Types: 10 Standard drinks or equivalent per week  Comment: social- none since jan 08-2019  . Drug use: Never  . Sexual activity: Yes  Other Topics Concern  . Not on file  Social History Narrative  . Not on file   Social Determinants of Health      Financial Resource Strain:   . Difficulty of Paying Living Expenses:   Food Insecurity:   . Worried About Charity fundraiser in the Last Year:   . Arboriculturist in the Last Year:   Transportation Needs:   . Film/video editor (Medical):   Marland Kitchen Lack of Transportation (Non-Medical):   Physical Activity:   . Days of Exercise per Week:   . Minutes of Exercise per Session:   Stress:   . Feeling of Stress :   Social Connections:   . Frequency of Communication with Friends and Family:   . Frequency  of Social Gatherings with Friends and Family:   . Attends Religious Services:   . Active Member of Clubs or Organizations:   . Attends Archivist Meetings:   Marland Kitchen Marital Status:   Intimate Partner Violence:   . Fear of Current or Ex-Partner:   . Emotionally Abused:   Marland Kitchen Physically Abused:   . Sexually Abused:      Review of Systems: General: negative for chills, fever, night sweats or weight changes.  Cardiovascular: negative for chest pain, dyspnea on exertion, edema, orthopnea, palpitations, paroxysmal nocturnal dyspnea or shortness of breath Dermatological: negative for rash Respiratory: negative for cough or wheezing Urologic: negative for hematuria Abdominal: negative for nausea, vomiting, diarrhea, bright red blood per rectum, melena, or hematemesis Neurologic: negative for visual changes, syncope, or dizziness All other systems reviewed and are otherwise negative except as noted above.    Blood pressure 136/86, pulse 63, height 5\' 5"  (1.651 m), weight 167 lb 12.8 oz (76.1 kg), SpO2 99 %.  General appearance: alert and no distress Neck: no adenopathy, no carotid bruit, no JVD, supple, symmetrical, trachea midline and thyroid not enlarged, symmetric, no tenderness/mass/nodules Lungs: clear to auscultation bilaterally Heart: Soft apical systolic murmur consistent with mitral regurgitation. Extremities: extremities normal, atraumatic, no cyanosis or edema Pulses: 2+ and symmetric Skin: Skin color, texture, turgor normal. No rashes or lesions Neurologic: Alert and oriented X 3, normal strength and tone. Normal symmetric reflexes. Normal coordination and gait  EKG not performed today  ASSESSMENT AND PLAN:   Mitral valve prolapse History of mitral valve prolapse dating back decades.  His last evaluation was 15 years ago.  He is very active and athletic.  He is noticed increasing shortness of breath.  A 2D echocardiogram performed 09/01/2019 showed EF of 40% with  bileaflet prolapse and moderate MR.  I am going to schedule him for a TEE, right left heart cath (radial/brachial) after which I will refer him to Dr. Lilly Cove for surgical consultation for minimally invasive mitral valve repair.      Lorretta Harp MD FACP,FACC,FAHA, [facility] 09/20/2019 12:08 PM     For TEE; no changes. Kirk Ruths

## 2019-10-06 ENCOUNTER — Ambulatory Visit: Payer: BC Managed Care – PPO | Admitting: Cardiovascular Disease

## 2019-10-09 ENCOUNTER — Encounter: Payer: Self-pay | Admitting: Thoracic Surgery (Cardiothoracic Vascular Surgery)

## 2019-10-09 ENCOUNTER — Institutional Professional Consult (permissible substitution) (INDEPENDENT_AMBULATORY_CARE_PROVIDER_SITE_OTHER): Payer: BC Managed Care – PPO | Admitting: Thoracic Surgery (Cardiothoracic Vascular Surgery)

## 2019-10-09 ENCOUNTER — Other Ambulatory Visit: Payer: Self-pay | Admitting: Thoracic Surgery (Cardiothoracic Vascular Surgery)

## 2019-10-09 ENCOUNTER — Other Ambulatory Visit: Payer: Self-pay

## 2019-10-09 VITALS — BP 122/74 | HR 76 | Temp 97.7°F | Resp 16 | Ht 65.0 in | Wt 165.0 lb

## 2019-10-09 DIAGNOSIS — I340 Nonrheumatic mitral (valve) insufficiency: Secondary | ICD-10-CM

## 2019-10-09 DIAGNOSIS — I341 Nonrheumatic mitral (valve) prolapse: Secondary | ICD-10-CM | POA: Diagnosis not present

## 2019-10-09 NOTE — Progress Notes (Signed)
22/21, ECHO

## 2019-10-09 NOTE — Progress Notes (Signed)
[address]       [place] [address]             [place] REPORT  Referring Provider is Lorretta Harp, MD PCP is Maximiano Coss, NP  Chief Complaint  Patient presents with  . Mitral Regurgitation    Surgical eval, Cardiac Cath and TEE 10/05/19, ECHO 09/05/19  . Mitral Valve Prolapse    HPI:  Patient is a 49 year old male with mitral valve prolapse who has been referred for surgical consultation to discuss treatment options for management of severe symptomatic primary mitral regurgitation.  Patient states that he was first noted to have mitral valve prolapse approximately 15 years ago when he underwent an echocardiogram as part of the work-up for symptoms of palpitations.  He was told at the time that the valve is not leaking much and he did not need surgical intervention but that eventually it might progress.  He has not had regular follow-up with a cardiologist until recently.  Patient developed COVID-19 infection in October 2020.  Symptoms included fever, body aches, and headaches which lasted for approximately a day and a half and loss of taste and smell which recovered within 6 or 7 days.  After his recovery he experienced significant exertional shortness of breath with initial attempt to return to exercising on a regular basis, and he states that it took approximately a month for him to get back to his baseline exercise tolerance.  Over the past 6 months he has noticed more more symptoms of exertional shortness of breath and occasional tachypalpitations.  He exercises religiously 6 days a week including strenuous high intensity training and running on a regular basis.  He has noticed a significant change in his exercise tolerance and he occasionally gets palpitations that are usually transient and without associated dizziness.  He was seen in follow-up by Maximiano Coss, NP who noticed a prominent systolic murmur on  exam and referred the patient to Dr. Quay Burow for formal cardiology consultation.  Transthoracic echocardiogram revealed bileaflet mitral valve prolapse with at least moderate mitral regurgitation and abnormal left ventricular systolic function, ejection fraction estimated 40%.  TEE confirmed the presence of bileaflet prolapse with "moderate to severe" mitral regurgitation.  Left ventricular ejection fraction was estimated 40 to 45%.  There was normal right ventricular size and function.  There was mild left atrial enlargement.  Left and right heart catheterization revealed normal coronary artery anatomy with no significant coronary artery disease and normal right heart pressures.  Cardiothoracic surgical consultation was requested.  Patient is married and lives locally in [place] with his wife.  He works full-time as an Systems developer.  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  He exercises 6 days a week including very strenuous activities including high intensity training, CrossFit, and running.  He states that he has definitely noticed a significant changes in his exercise tolerance and in particular that he gets short of breath more easily with strenuous exertion.  He denies resting shortness of breath, PND, orthopnea, or lower extremity edema.  He reports frequent palpitations without any history of dizziness or syncope.  Past Medical History:  Diagnosis Date  . Allergy   . Arrhythmia   . Heart murmur    due to MVP   . Hyperlipidemia   . Mitral regurgitation   . Mitral valve prolapse     Past Surgical History:  Procedure Laterality Date  . COLONOSCOPY    . POLYPECTOMY  15 yrs ago    ? type of polyps per pt   . RIGHT/LEFT HEART CATH AND CORONARY ANGIOGRAPHY N/A 10/05/2019   Procedure: RIGHT/LEFT HEART CATH AND CORONARY ANGIOGRAPHY;  Surgeon: Lorretta Harp, MD;  Location: [facility];  Service: Cardiovascular;  Laterality: N/A;    . TEE WITHOUT CARDIOVERSION N/A 10/05/2019   Procedure: TRANSESOPHAGEAL ECHOCARDIOGRAM (TEE);  Surgeon: Lelon Perla, MD;  Location: [facility] ENDOSCOPY;  Service: Cardiovascular;  Laterality: N/A;  . VASECTOMY      Family History  Problem Relation Age of Onset  . Cancer Mother   . Healthy Mother   . Breast cancer Mother   . Hyperlipidemia Father   . Hypertension Father   . Healthy Sister   . Cancer Maternal Grandmother   . Stroke Maternal Grandfather   . Hyperlipidemia Paternal Grandfather   . Colon polyps Maternal Uncle   . Colon cancer Neg Hx   . Esophageal cancer Neg Hx   . Rectal cancer Neg Hx   . Stomach cancer Neg Hx     Social History   Socioeconomic History  . Marital status: Married    Spouse name: Not on file  . Number of children: 1  . Years of education: Not on file  . Highest education level: Not on file  Occupational History  . Not on file  Tobacco Use  . Smoking status: Former Smoker    Quit date: 06/15/1997    Years since quitting: 22.3  . Smokeless tobacco: Former Network engineer and Sexual Activity  . Alcohol use: Yes    Alcohol/week: 10.0 standard drinks    Types: 10 Standard drinks or equivalent per week    Comment: social- none since jan 08-2019  . Drug use: Never  . Sexual activity: Yes  Other Topics Concern  . Not on file  Social History Narrative  . Not on file   Social Determinants of Health   Financial Resource Strain:   . Difficulty of Paying Living Expenses:   Food Insecurity:   . Worried About Charity fundraiser in the Last Year:   . Arboriculturist in the Last Year:   Transportation Needs:   . Film/video editor (Medical):   Marland Kitchen Lack of Transportation (Non-Medical):   Physical Activity:   . Days of Exercise per Week:   . Minutes of Exercise per Session:   Stress:   . Feeling of Stress :   Social Connections:   . Frequency of Communication with Friends and Family:   . Frequency of Social Gatherings with Friends and  Family:   . Attends Religious Services:   . Active Member of Clubs or Organizations:   . Attends Archivist Meetings:   Marland Kitchen Marital Status:   Intimate Partner Violence:   . Fear of Current or Ex-Partner:   . Emotionally Abused:   Marland Kitchen Physically Abused:   . Sexually Abused:     Current Outpatient Medications  Medication Sig Dispense Refill  . albuterol (VENTOLIN HFA) 108 (90 Base) MCG/ACT inhaler Inhale 2 puffs into the lungs every 6 (six) hours as needed for wheezing or shortness of breath. 18 g 1  . DIGESTIVE ENZYMES PO Take 1 tablet by mouth daily.     . Multiple Vitamin (MULTIVITAMIN) tablet Take 1 tablet by mouth daily.    . OMEGA-3 FATTY ACIDS PO Take 1,000 mg by mouth daily.     Marland Kitchen  psyllium (METAMUCIL) 58.6 % packet Take 1 packet by mouth daily.    . Turmeric 500 MG CAPS Take 1,000 mg by mouth in the morning and at bedtime.      Current Facility-Administered Medications  Medication Dose Route Frequency Provider Last Rate Last Admin  . sodium chloride flush (NS) 0.9 % injection 3 mL  3 mL Intravenous Q12H Lorretta Harp, MD        No Known Allergies    Review of Systems:   General:  normal appetite, normal energy, no weight gain, no weight loss, no fever  Cardiac:  no chest pain with exertion, no chest pain at rest, +SOB with exertion, no resting SOB, no PND, no orthopnea, + palpitations, no arrhythmia, no atrial fibrillation, no LE edema, no dizzy spells, no syncope  Respiratory:  no shortness of breath, no home oxygen, no productive cough, no dry cough, no bronchitis, no wheezing, no hemoptysis, no asthma, no pain with inspiration or cough, no sleep apnea, no CPAP at night  GI:   no difficulty swallowing, no reflux, no frequent heartburn, no hiatal hernia, no abdominal pain, no constipation, no diarrhea, no hematochezia, no hematemesis, no melena  GU:   no dysuria,  no frequency, no urinary tract infection, no hematuria, no enlarged prostate, no kidney stones, no  kidney disease  Vascular:  no pain suggestive of claudication, no pain in feet, no leg cramps, no varicose veins, no DVT, no non-healing foot ulcer  Neuro:   no stroke, no TIA's, no seizures, no headaches, no temporary blindness one eye,  no slurred speech, no peripheral neuropathy, no chronic pain, no instability of gait, no memory/cognitive dysfunction  Musculoskeletal: no arthritis, no joint swelling, no myalgias, no difficulty walking, normal mobility   Skin:   no rash, no itching, no skin infections, no pressure sores or ulcerations  Psych:   no anxiety, no depression, no nervousness, no unusual recent stress  Eyes:   no blurry vision, no floaters, no recent vision changes, + wears glasses or contacts  ENT:   no hearing loss, no loose or painful teeth, no dentures, last saw dentist January 2021  Hematologic:  no easy bruising, no abnormal bleeding, no clotting disorder, no frequent epistaxis  Endocrine:  no diabetes, does not check CBG's at home     Physical Exam:   BP 122/74 (BP Location: Right Arm, Patient Position: Sitting, Cuff Size: Large)   Pulse 76   Temp 97.7 F (36.5 C)   Resp 16   Ht 5\' 5"  (1.651 m)   Wt 165 lb (74.8 kg)   SpO2 100% Comment: RA  BMI 27.46 kg/m   General:  Thin, fit,  well-appearing  HEENT:  Unremarkable   Neck:   no JVD, no bruits, no adenopathy   Chest:   clear to auscultation, symmetrical breath sounds, no wheezes, no rhonchi   CV:   RRR, grade IV/VI late systolic murmur   Abdomen:  soft, non-tender, no masses   Extremities:  warm, well-perfused, pulses palpable, no LE edema  Rectal/GU  Deferred  Neuro:   Grossly non-focal and symmetrical throughout  Skin:   Clean and dry, no rashes, no breakdown   Diagnostic Tests:  ECHOCARDIOGRAM REPORT       Patient Name:  Kevin Medina Date of Exam: 09/01/2019  Medical Rec #: [id number]   Height:    65.0 in  Accession #:  [id number]   Weight:    168.0 lb  Date of Birth: 1971-05-03  BSA:     1.837 m  Patient Age:  48 years    BP:      130/78 mmHg  Patient Gender: M       HR:      66 bpm.  Exam Location: [facility]   Procedure: 2D Echo, 3D Echo, Cardiac Doppler, Color Doppler and Strain  Analysis                 MODIFIED REPORT:  This report was modified by Dorris Carnes MD on 09/01/2019 due to To complete.  Indications:   I34.1 Mitral valve prolapse    History:     Patient has no prior history of Echocardiogram  examinations.          Mitral Valve Prolapse, Signs/Symptoms:Dyspnea; Risk          Factors:Former Smoker. COVID-19+ 03/2019.    Sonographer:   Jessee Avers, RDCS  Referring Phys: [place] Phys: Dorris Carnes MD   IMPRESSIONS    1. Left ventricular ejection fraction, by estimation, is 40%%. The left  ventricle has normal function. The left ventricle demonstrates regional  wall motion abnormalities (see scoring diagram/findings for description).  Left ventricular diastolic  parameters are consistent with Grade II diastolic dysfunction  (pseudonormalization). The average left ventricular global longitudinal  strain is -15.3 %.  2. Right ventricular systolic function is mildly reduced. The right  ventricular size is normal.  3. The mitral valve is abnormal. Moderate mitral valve  regurgitation.Bileaflet prolapse of MV  4. The aortic valve is normal in structure. Aortic valve regurgitation is  trivial.  5. Aortic dilatation noted. There is borderline dilatation of the aortic  root measuring 40 mm.  6. Left atrial size was mildly dilated.  7. The inferior vena cava is normal in size with greater than 50%  respiratory variability, suggesting right atrial pressure of 3 mmHg.   FINDINGS  Left Ventricle: Left ventricular ejection fraction, by estimation, is  40%%. The left ventricle has normal function. The left ventricle  demonstrates regional  wall motion abnormalities. The average left  ventricular global longitudinal strain is -15.3 %.  The left ventricular internal cavity size was normal in size. There is no  left ventricular hypertrophy. Left ventricular diastolic parameters are  consistent with Grade II diastolic dysfunction (pseudonormalization).   Right Ventricle: The right ventricular size is normal. No increase in  right ventricular wall thickness. Right ventricular systolic function is  mildly reduced.   Left Atrium: Left atrial size was mildly dilated.   Right Atrium: Right atrial size was normal in size.   Pericardium: There is no evidence of pericardial effusion.   Mitral Valve: The mitral valve is abnormal. There is moderate prolapse of  both leaflets of the mitral valve. Moderate to severe mitral valve  regurgitation. Mild to moderate mitral valve stenosis.   Tricuspid Valve: The tricuspid valve is normal in structure. Tricuspid  valve regurgitation is trivial.   Aortic Valve: The aortic valve is normal in structure. Aortic valve  regurgitation is trivial.   Pulmonic Valve: The pulmonic valve was normal in structure. Pulmonic valve  regurgitation is trivial.   Aorta: Aortic dilatation noted. There is borderline dilatation of the  aortic root measuring 40 mm.   Venous: The inferior vena cava is normal in size with greater than 50%  respiratory variability, suggesting right atrial pressure of 3 mmHg.   IAS/Shunts: No atrial level shunt detected by color flow Doppler.  LEFT VENTRICLE  PLAX 2D  LVIDd:     5.20 cm Diastology  LVIDs:     2.90 cm LV e' lateral:  6.09 cm/s  LV PW:     1.00 cm LV E/e' lateral: 10.3  LV IVS:    1.00 cm LV e' medial:  5.33 cm/s  LVOT diam:   2.20 cm LV E/e' medial: 11.8  LV SV:     73  LV SV Index:  40    2D Longitudinal Strain  LVOT Area:   3.80 cm 2D Strain GLS (A2C):  -14.5 %             2D Strain GLS (A3C):   -17.0 %             2D Strain GLS (A4C):  -14.3 %             2D Strain GLS Avg:   -15.3 %               3D Volume EF:             3D EF:    56 %             LV EDV:    188 ml             LV ESV:    84 ml             LV SV:    105 ml   RIGHT VENTRICLE  RV Basal diam: 3.40 cm  RV S prime:   8.49 cm/s  TAPSE (M-mode): 2.8 cm   LEFT ATRIUM       Index    RIGHT ATRIUM      Index  LA diam:    3.60 cm 1.96 cm/m RA Pressure: 3.00 mmHg  LA Vol (A2C):  72.8 ml 39.63 ml/m RA Area:   16.10 cm  LA Vol (A4C):  63.2 ml 34.41 ml/m RA Volume:  38.10 ml 20.74 ml/m  LA Biplane Vol: 67.3 ml 36.64 ml/m  AORTIC VALVE  LVOT Vmax:  96.10 cm/s  LVOT Vmean: 63.500 cm/s  LVOT VTI:  0.193 m    AORTA  Ao Root diam: 4.00 cm  Ao Asc diam: 3.20 cm   MITRAL VALVE        TRICUSPID VALVE               Estimated RAP: 3.00 mmHg    MV E velocity: 62.90 cm/s SHUNTS  MV A velocity: 50.30 cm/s Systemic VTI: 0.19 m  MV E/A ratio: 1.25    Systemic Diam: 2.20 cm   Dorris Carnes MD  Electronically signed by Dorris Carnes MD  Signature Date/Time: 09/01/2019/11:33:31 PM      TRANSESOPHOGEAL ECHO REPORT       Patient Name:  Kathi Simpers Date of Exam: 10/05/2019  Medical Rec #: [id number]    Height:    65.0 in  Accession #:  [id number]    Weight:    167.8 lb  Date of Birth: 06-08-1971    BSA:     1.836 m  Patient Age:  88 years     BP:      123/75 mmHg  Patient Gender: M        HR:      65 bpm.  Exam Location: Inpatient   Procedure: Transesophageal Echo, Cardiac Doppler, Color Doppler and 3D  Echo   Indications:   Mitral valve prolapse    History:  Patient has prior history of Echocardiogram examinations,  most          recent 09/05/2019. Mitral Valve Disease  and Mitral Valve          Prolapse; Signs/Symptoms:Dyspnea.    Sonographer:   Dustin Flock  Referring Phys: [place]  Diagnosing Phys: Kirk Ruths MD   PROCEDURE: The transesophogeal probe was passed without difficulty through  the esophogus of the patient. Sedation performed by performing physician.  The patient was monitored while under deep sedation. Anesthestetic  sedation was provided intravenously by  Anesthesiology: 260mg  of Propofol. The patient developed no complications  during the procedure.   IMPRESSIONS    1. Anteroapical hypokinesis with overall mild to moderate LV dysfunction;  mildly dilated ascending aorta; bileaflet MVP (posterior > anterior) with  moderate to severe MR (2 jets; difficult to quantitate).  2. Left ventricular ejection fraction, by estimation, is 40 to 45%. The  left ventricle has mildly decreased function. The left ventricle  demonstrates regional wall motion abnormalities (see scoring  diagram/findings for description).  3. Right ventricular systolic function is normal. The right ventricular  size is normal.  4. Left atrial size was mildly dilated. No left atrial/left atrial  appendage thrombus was detected.  5. The mitral valve is abnormal. Moderate to severe mitral valve  regurgitation.  6. The aortic valve is tricuspid. Aortic valve regurgitation is not  visualized.  7. Aortic dilatation noted. There is mild dilatation of the aortic root  measuring 38 mm. There is mild (Grade II) plaque involving the descending  aorta.   FINDINGS  Left Ventricle: Left ventricular ejection fraction, by estimation, is 40  to 45%. The left ventricle has mildly decreased function. The left  ventricle demonstrates regional wall motion abnormalities. The left  ventricular internal cavity size was normal  in size. There is no left ventricular hypertrophy.   Right Ventricle: The right ventricular size is normal.Right  ventricular  systolic function is normal.   Left Atrium: Left atrial size was mildly dilated. No left atrial/left  atrial appendage thrombus was detected.   Right Atrium: Right atrial size was normal in size.   Pericardium: There is no evidence of pericardial effusion.   Mitral Valve: The mitral valve is abnormal. There is moderate prolapse of  both leaflets of the mitral valve. Moderate to severe mitral valve  regurgitation.   Tricuspid Valve: The tricuspid valve is normal in structure. Tricuspid  valve regurgitation is mild.   Aortic Valve: The aortic valve is tricuspid. Aortic valve regurgitation is  not visualized.   Pulmonic Valve: The pulmonic valve was normal in structure. Pulmonic valve  regurgitation is trivial.   Aorta: Aortic dilatation noted. There is mild dilatation of the aortic  root measuring 38 mm. There is mild (Grade II) plaque involving the  descending aorta.   IAS/Shunts: The interatrial septum is aneurysmal. No atrial level shunt  detected by color flow Doppler.   Additional Comments: Anteroapical hypokinesis with overall mild to  moderate LV dysfunction; mildly dilated ascending aorta; bileaflet MVP  (posterior > anterior) with moderate to severe MR (2 jets; difficult to  quantitate).   Kirk Ruths MD  Electronically signed by Kirk Ruths MD  Signature Date/Time: 10/05/2019/1:33:15 PM     RIGHT/LEFT HEART CATH AND CORONARY ANGIOGRAPHY  Conclusion    Hemodynamic findings consistent with mitral valve regurgitation.   DEZMOND KLOCKE is a 49 y.o. male    [id number] LOCATION:  FACILITY: [facility]  PHYSICIAN: Quay Burow,  M.D. 1971/03/31   DATE OF PROCEDURE:  10/05/2019  DATE OF DISCHARGE:     CARDIAC CATHETERIZATION     History obtained from chart review.Jancarlo Balkindis a 49 y.o.thin and fit appearing married Caucasian male father of one 12 year old daughter who works as an Systems developer. He was referred by  Maximiano Coss, NP for evaluation of dyspnea and mitral valve prolapse.I last saw him in the office 07/21/2019.He basically has no cardiac risk factors. 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 He did have a 2D echo and GXT back in [place] 15 years ago and was told he had mitral valve prolapse. He was a Wellsite geologist at the time. He contracted COVID-19 in late October with fever for a day and a half, body aches, headaches and loss of taste and smell for 6 days but recovered. He is noticed recently when he works out that he is more fatigued and shortness of breath than he usually is. His PCP noticed a late systolic click and murmur.  Since I saw him last he has had a 2D echo performed 09/01/2019 revealed an EF of 40% with normal LV size. He has grade 2 diastolic dysfunction, bilateral mitral valve prolapse with moderate to severe mitral regurgitation. He is functionally limited and has noticed increasing shortness of breath on exertion. He will need right left heart cath, transesophageal echo and referral to Dr. Vito Berger consideration of minimally invasive mitral valve repair.    IMPRESSION: Mr. Pasqualone and has normal coronary arteries and normal filling pressures.  He did have a transesophageal echo performed earlier this morning revealing severe bileaflet prolapse and moderate to severe MR with moderate LV dysfunction.  He will be discharged home later this afternoon as an outpatient.  We will make an appointment for him to see Dr. Lilly Cove, cardiothoracic surgeon, as an outpatient in the very near future.  Quay Burow. MD, [facility] 10/05/2019 3:32 PM      Indications  Severe mitral regurgitation [I34.0 (ICD-10-CM)]  Nonischemic cardiomyopathy ([place]) [I42.8 (ICD-10-CM)]  Procedural Details  Technical Details PROCEDURE DESCRIPTION:   The patient was brought to the second floor [facility] cath lab in the postabsorptive state. He was premedicated with IV Versed and  fentanyl. His right wrist and antecubital fossa as well as right groinWere prepped and shaved in usual sterile fashion. Xylocaine 1% was used for local anesthesia. A 5 French sheath was inserted into the right common femoralartery using standard Seldinger technique.  A 5 French sheath was inserted into the right antecubital vein.  5 French right left Judkins diagnostic catheters on 5 French pigtail catheter were used for selective coronary angiography, obtain left heart pressures and right heart cath using a 5 [place] Swan-Ganz catheter obtaining sequential pressures and blood samples for the determination of Fick cardiac output.  Isovue dye was used for the entirety of the case.  Retrograde aortic, left ventricular pullback pressures were recorded.  I did attempt to access the right radial artery and got flashback 4 times but was unable to advance the wire.  Estimated blood loss <50 mL.   During this procedure medications were administered to achieve and maintain moderate conscious sedation while the patient's heart rate, blood pressure, and oxygen saturation were continuously monitored and I was present face-to-face 100% of this time.  Medications (Filter: Administrations occurring from 10/05/19 1427 to 10/05/19 1532) midazolam (VERSED) injection (mg) Total dose:  0.5 mg Date/Time  Rate/Dose/Volume Action  10/05/19 1441  0.5 mg Given  fentaNYL (SUBLIMAZE) injection (mcg) Total dose:  25 mcg Date/Time  Rate/Dose/Volume Action  10/05/19 1441  25 mcg Given    Heparin (Porcine) in NaCl 1000-0.9 UT/500ML-% SOLN (mL) Total volume:  1,000 mL Date/Time  Rate/Dose/Volume Action  10/05/19 1443  500 mL Given  1443  500 mL Given    lidocaine (PF) (XYLOCAINE) 1 % injection (mL) Total volume:  17 mL Date/Time  Rate/Dose/Volume Action  10/05/19 1446  2 mL Given  1505  15 mL Given    iohexol (OMNIPAQUE) 350 MG/ML injection (mL) Total volume:  35 mL Date/Time  Rate/Dose/Volume Action  10/05/19 1526   35 mL Given    Sedation Time  Sedation Time Physician-1: 36 minutes 24 seconds  Contrast  Medication Name Total Dose  iohexol (OMNIPAQUE) 350 MG/ML injection 35 mL    Radiation/Fluoro  Fluoro time: 3.5 (min) DAP: 10626 (mGycm2) Cumulative Air Kerma: 148 (mGy)  Coronary Findings  Diagnostic Dominance: Right No diagnostic findings have been documented. Intervention  No interventions have been documented. Right Heart  Right Heart Pressures Hemodynamic findings consistent with mitral valve regurgitation. Right atrial pressure-4/3 Right ventricular pressure-19/0 Pulmonary artery pressure-17/5, mean 10 Pulmonary wedge pressure-A-wave 8, V wave 6, mean 5 LVEDP-14 Cardiac output-5.87 L/min with an index of 3.24 L/min/m by Fick  Coronary Diagrams  Diagnostic Dominance: Right  Intervention  Implants   No implant documentation for this case.  Syngo Images  Show images for CARDIAC CATHETERIZATION  Images on Long Term Storage  Show images for Radin, Bukhari "Rob"   Link to Procedure Log  Procedure Log    Hemo Data   Most Recent Value  Fick Cardiac Output 5.87 L/min  Fick Cardiac Output Index 3.24 (L/min)/BSA  RA A Wave 4 mmHg  RA V Wave 3 mmHg  RA Mean 2 mmHg  RV Systolic Pressure 19 mmHg  RV Diastolic Pressure 0 mmHg  RV EDP 3 mmHg  PA Systolic Pressure 17 mmHg  PA Diastolic Pressure 5 mmHg  PA Mean 10 mmHg  PW A Wave 8 mmHg  PW V Wave 6 mmHg  PW Mean 5 mmHg  AO Systolic Pressure [id number] mmHg  AO Diastolic Pressure 67 mmHg  AO Mean 98 mmHg  LV Systolic Pressure [id number] mmHg  LV Diastolic Pressure 4 mmHg  LV EDP 14 mmHg  AOp Systolic Pressure [id number]  AOp Diastolic Pressure 76 mmHg  AOp Mean Pressure 99 mmHg  LVp Systolic Pressure 0000000 mmHg  LVp Diastolic Pressure 1 mmHg  LVp EDP Pressure 11 mmHg  QP/QS 1  TPVR Index 3.09 HRUI  TSVR Index 30.29 HRUI  PVR SVR Ratio 0.05  TPVR/TSVR Ratio 0.1     Impression:  Patient has mitral valve prolapse  with stage D severe symptomatic primary mitral regurgitation.  He describes a long history of palpitations that have increased in frequency and as well as a several month history of symptoms of exertional shortness of breath and decreased exercise tolerance.  This occurs in the setting of relatively young physically fit individual who exercises on a strenuous basis 6 days a week.  I have personally reviewed the patient's recent transthoracic and transesophageal echocardiograms as well as his diagnostic cardiac catheterization.  Echocardiograms demonstrate bileaflet prolapse with Barlow's type valve functional anatomy including severe prolapse involving multiple segments of the posterior leaflet.  There are multiple elongated primary chordae tendinae but I do not see any ruptured primary chordae tendinae nor clearly flail segments.  Quantification of the severity of mitral regurgitation was  difficult on TEE due to the presence of multiple jets, but the severity was officially graded "moderate to severe".  There is mild left atrial enlargement.  Both transthoracic and transesophageal echocardiograms revealed significant left ventricular systolic dysfunction with ejection fraction estimated only 40 to 45% in the setting of severe mitral regurgitation.  Diagnostic cardiac catheterization is notable for the absence of significant coronary artery disease and revealed normal cardiac output and right heart pressures.  I agree the patient would best be treated with elective mitral valve repair.  Although the patient does not have any clear flail segments and the severity of mitral regurgitation was somewhat difficult to quantify, the MR appears at least moderately severe and is associated with progressive symptoms and significant left ventricular systolic dysfunction.  Based upon review of the patient's TEE I feel there is better than 98% likelihood of successful and durable valve repair associated with less than 1% risk  of operative mortality.  The patient would likely be a good candidate for minimally invasive approach for surgery.   Plan:  The patient and his wife were counseled at length regarding the indications, risks and potential benefits of mitral valve repair.  The rationale for elective surgery has been explained, including a comparison between surgery and continued medical therapy with close follow-up.  The likelihood of successful and durable mitral valve repair has been discussed with particular reference to the findings of their recent echocardiogram.  Based upon these findings and previous experience, I have quoted them a greater than 98 percent likelihood of successful valve repair with less than 1 percent risk of mortality or major morbidity.  Alternative surgical approaches have been discussed including a comparison between conventional sternotomy and minimally-invasive techniques.  The relative risks and benefits of each have been reviewed as they pertain to the patient's specific circumstances, and expectations for the patient's postoperative convalescence has been discussed.  The patient desires to proceed with surgery in the near future.  We tentatively plan to proceed on Wednesday, November 22, 2019.  The patient will return to our office for follow-up prior to surgery on November 20, 2019.  Prior to that he will undergo CT angiography to evaluate the feasibility for peripheral cannulation for surgery.      I spent in excess of 90 minutes during the conduct of this office consultation and >50% of this time involved direct face-to-face encounter with the patient for counseling and/or coordination of their care.    Valentina Gu. Roxy Manns, MD 10/09/2019 4:38 PM

## 2019-10-09 NOTE — Patient Instructions (Signed)
Stop taking Turmeric and fish oil 7 days prior to surgery  Continue taking all other medications and supplements without change through the day before surgery.  Make sure to bring all of your medications with you when you come for your Pre-Admission Testing appointment at [facility] Short-Stay Department.  Have nothing to eat or drink after midnight the night before surgery.  On the morning of surgery do not take any medications  At your appointment for Pre-Admission Testing at the [facility] Short-Stay Department you will be asked to sign permission forms for your upcoming surgery.  By definition your signature on these forms implies that you and/or your designee provide full informed consent for your planned surgical procedure(s), that alternative treatment options have been discussed, that you understand and accept any and all potential risks, and that you have some understanding of what to expect for your post-operative convalescence.  For any major cardiac surgical procedure potential operative risks include but are not limited to at least some risk of death, stroke or other neurologic complication, myocardial infarction, congestive heart failure, respiratory failure, renal failure, bleeding requiring blood transfusion and/or reexploration, irregular heart rhythm, heart block or bradycardia requiring permanent pacemaker, pneumonia, pericardial effusion, pleural effusion, wound infection, pulmonary embolus or other thromboembolic complication, chronic pain, or other complications related to the specific procedure(s) performed.  Please call to schedule a follow-up appointment in our office prior to surgery if you have any unresolved questions about your planned surgical procedure, the associated risks, alternative treatment options, and/or expectations for your post-operative recovery.

## 2019-10-13 ENCOUNTER — Other Ambulatory Visit: Payer: Self-pay

## 2019-10-13 ENCOUNTER — Encounter: Payer: Self-pay | Admitting: Cardiovascular Disease

## 2019-10-13 ENCOUNTER — Ambulatory Visit (INDEPENDENT_AMBULATORY_CARE_PROVIDER_SITE_OTHER): Payer: BC Managed Care – PPO | Admitting: Cardiovascular Disease

## 2019-10-13 VITALS — BP 120/82 | HR 67 | Ht 65.0 in | Wt 167.2 lb

## 2019-10-13 DIAGNOSIS — I340 Nonrheumatic mitral (valve) insufficiency: Secondary | ICD-10-CM | POA: Diagnosis not present

## 2019-10-13 NOTE — Patient Instructions (Signed)
Medication Instructions:  The current medical regimen is effective;  continue present plan and medications.  *If you need a refill on your cardiac medications before your next appointment, please call your pharmacy*    Follow-Up: At [facility], you and your health needs are our priority.  As part of our continuing mission to provide you with exceptional heart care, we have created designated Provider Care Teams.  These Care Teams include your primary Cardiologist (physician) and Advanced Practice Providers (APPs -  Physician Assistants and Nurse Practitioners) who all work together to provide you with the care you need, when you need it.  We recommend signing up for the patient portal called "MyChart".  Sign up information is provided on this After Visit Summary.  MyChart is used to connect with patients for Virtual Visits (Telemedicine).  Patients are able to view lab/test results, encounter notes, upcoming appointments, etc.  Non-urgent messages can be sent to your provider as well.   To learn more about what you can do with MyChart, go to [email or web address].    Your next appointment:   3 month(s)  The format for your next appointment:   In Person  Provider:   Quay Burow, MD

## 2019-10-13 NOTE — Progress Notes (Signed)
Mr. Kevin Medina returns today for post hospital follow-up after his recent TEE, right left heart cath this past Monday for severe MR and nonischemic cardiomyopathy.  I ended up doing femoral diagnostic cath on him revealing normal coronary arteries.  I placed a minx closure device.  He has slight bruising and a small "knot under his puncture site but no bruit.  I reassured him.  Of asked him to refrain from heavy lifting for 1 more week.  He is scheduled for minimally invasive mitral valve repair by Dr. Roxy Manns on 10/22/2019.  Lorretta Harp, M.D., [name], [facility], [name] [address] [place], [place]  [address]  [phone] 10/13/2019 10:09 AM

## 2019-10-13 NOTE — Addendum Note (Signed)
Addended by: Hinton Dyer on: 10/13/2019 10:55 AM   Modules accepted: Orders

## 2019-10-24 ENCOUNTER — Encounter: Payer: Self-pay | Admitting: *Deleted

## 2019-10-24 ENCOUNTER — Other Ambulatory Visit: Payer: Self-pay | Admitting: *Deleted

## 2019-10-24 DIAGNOSIS — I340 Nonrheumatic mitral (valve) insufficiency: Secondary | ICD-10-CM

## 2019-11-17 NOTE — Progress Notes (Signed)
[place], [place] [address] Phone: [phone] Fax: [phone]      Your procedure is scheduled on November 22, 2019.  Report to [facility] Main Entrance "A" at 6:30 A.M., and check in at the Admitting office.  Call this number if you have problems the morning of surgery:  [phone]  Call [phone] if you have any questions prior to your surgery date Monday-Friday 8am-4pm    Remember:  Do not eat or drink after midnight the night before your surgery   Take these medicines the morning of surgery with A SIP OF WATER: albuterol (VENTOLIN HFA) - as needed  As of today, STOP taking any Aspirin (unless otherwise instructed by your surgeon) and Aspirin containing products, Aleve, Naproxen, Ibuprofen, Motrin, Advil, Goody's, BC's, all herbal medications, fish oil, and all vitamins.                      Do not wear jewelry            Do not wear lotions, powders, colognes, or deodorant.            Do not shave 48 hours prior to surgery.  Men may shave face and neck.            Do not bring valuables to the hospital.            [facility] is not responsible for any belongings or valuables.  Do NOT Smoke (Tobacco/Vapping) or drink Alcohol 24 hours prior to your procedure If you use a CPAP at night, you may bring all equipment for your overnight stay.   Contacts, glasses, dentures or bridgework may not be worn into surgery.      For patients admitted to the hospital, discharge time will be determined by your treatment team.   Patients discharged the day of surgery will not be allowed to drive home, and someone needs to stay with them for 24 hours.    Special instructions:   [place]- Preparing For Surgery  Before surgery, you can play an important role. Because skin is not sterile, your skin needs to be as free of germs as possible. You can reduce the number of germs on your skin by  washing with CHG (chlorahexidine gluconate) Soap before surgery.  CHG is an antiseptic cleaner which kills germs and bonds with the skin to continue killing germs even after washing.    Oral Hygiene is also important to reduce your risk of infection.  Remember - BRUSH YOUR TEETH THE MORNING OF SURGERY WITH YOUR REGULAR TOOTHPASTE  Please do not use if you have an allergy to CHG or antibacterial soaps. If your skin becomes reddened/irritated stop using the CHG.  Do not shave (including legs and underarms) for at least 48 hours prior to first CHG shower. It is OK to shave your face.  Please follow these instructions carefully.   1. Shower the NIGHT BEFORE SURGERY and the MORNING OF SURGERY with CHG Soap.   2. If you chose to wash your hair, wash your hair first as usual with your normal shampoo.  3. After you shampoo, rinse your hair and body thoroughly to remove the shampoo.  4. Use CHG as you would any other liquid soap. You can apply CHG directly to the skin and wash gently with a scrungie or a clean washcloth.   5. Apply the CHG Soap to  your body ONLY FROM THE NECK DOWN.  Do not use on open wounds or open sores. Avoid contact with your eyes, ears, mouth and genitals (private parts). Wash Face and genitals (private parts)  with your normal soap.   6. Wash thoroughly, paying special attention to the area where your surgery will be performed.  7. Thoroughly rinse your body with warm water from the neck down.  8. DO NOT shower/wash with your normal soap after using and rinsing off the CHG Soap.  9. Pat yourself dry with a CLEAN TOWEL.  10. Wear CLEAN PAJAMAS to bed the night before surgery, wear comfortable clothes the morning of surgery  11. Place CLEAN SHEETS on your bed the night of your first shower and DO NOT SLEEP WITH PETS.   Day of Surgery:   Do not apply any deodorants/lotions.  Please wear clean clothes to the hospital/surgery center.   Remember to brush your teeth WITH  YOUR REGULAR TOOTHPASTE.   Please read over the following fact sheets that you were given.

## 2019-11-20 ENCOUNTER — Ambulatory Visit
Admission: RE | Admit: 2019-11-20 | Discharge: 2019-11-20 | Disposition: A | Discharge status: Home / Self Care | Payer: BC Managed Care – PPO | Source: Ambulatory Visit | Attending: Thoracic Surgery (Cardiothoracic Vascular Surgery) | Admitting: Thoracic Surgery (Cardiothoracic Vascular Surgery)

## 2019-11-20 ENCOUNTER — Ambulatory Visit (HOSPITAL_COMMUNITY)
Admission: RE | Admit: 2019-11-20 | Discharge: 2019-11-20 | Disposition: A | Discharge status: Home / Self Care | Payer: BC Managed Care – PPO | Source: Ambulatory Visit | Attending: Thoracic Surgery (Cardiothoracic Vascular Surgery) | Admitting: Thoracic Surgery (Cardiothoracic Vascular Surgery)

## 2019-11-20 ENCOUNTER — Encounter: Payer: Self-pay | Admitting: Thoracic Surgery (Cardiothoracic Vascular Surgery)

## 2019-11-20 ENCOUNTER — Encounter (HOSPITAL_COMMUNITY): Payer: Self-pay

## 2019-11-20 ENCOUNTER — Encounter (HOSPITAL_COMMUNITY)
Admission: RE | Admit: 2019-11-20 | Discharge: 2019-11-20 | Disposition: A | Discharge status: Home / Self Care | Payer: BC Managed Care – PPO | Source: Ambulatory Visit | Attending: Thoracic Surgery (Cardiothoracic Vascular Surgery) | Admitting: Thoracic Surgery (Cardiothoracic Vascular Surgery)

## 2019-11-20 ENCOUNTER — Ambulatory Visit: Payer: BC Managed Care – PPO | Admitting: Thoracic Surgery (Cardiothoracic Vascular Surgery)

## 2019-11-20 ENCOUNTER — Ambulatory Visit (INDEPENDENT_AMBULATORY_CARE_PROVIDER_SITE_OTHER): Payer: BC Managed Care – PPO | Admitting: Thoracic Surgery (Cardiothoracic Vascular Surgery)

## 2019-11-20 ENCOUNTER — Other Ambulatory Visit: Payer: Self-pay

## 2019-11-20 ENCOUNTER — Other Ambulatory Visit (HOSPITAL_COMMUNITY)
Admission: RE | Admit: 2019-11-20 | Discharge: 2019-11-20 | Disposition: A | Discharge status: Home / Self Care | Payer: BC Managed Care – PPO | Source: Ambulatory Visit | Attending: Thoracic Surgery (Cardiothoracic Vascular Surgery) | Admitting: Thoracic Surgery (Cardiothoracic Vascular Surgery)

## 2019-11-20 VITALS — BP 138/75 | HR 73 | Temp 97.7°F | Resp 20 | Ht 65.0 in | Wt 167.0 lb

## 2019-11-20 DIAGNOSIS — I340 Nonrheumatic mitral (valve) insufficiency: Secondary | ICD-10-CM | POA: Diagnosis not present

## 2019-11-20 DIAGNOSIS — Z01818 Encounter for other preprocedural examination: Secondary | ICD-10-CM | POA: Diagnosis not present

## 2019-11-20 DIAGNOSIS — Z20822 Contact with and (suspected) exposure to covid-19: Secondary | ICD-10-CM | POA: Diagnosis not present

## 2019-11-20 DIAGNOSIS — I351 Nonrheumatic aortic (valve) insufficiency: Secondary | ICD-10-CM | POA: Diagnosis not present

## 2019-11-20 DIAGNOSIS — I341 Nonrheumatic mitral (valve) prolapse: Secondary | ICD-10-CM

## 2019-11-20 HISTORY — DX: Exercise induced bronchospasm: J45.990

## 2019-11-20 LAB — PROTIME-INR
INR: 1.1 (ref 0.8–1.2)
Prothrombin Time: 13.5 seconds (ref 11.4–15.2)

## 2019-11-20 LAB — URINALYSIS, ROUTINE W REFLEX MICROSCOPIC
Bilirubin Urine: NEGATIVE
Glucose, UA: NEGATIVE mg/dL
Hgb urine dipstick: NEGATIVE
Ketones, ur: NEGATIVE mg/dL
Leukocytes,Ua: NEGATIVE
Nitrite: NEGATIVE
Protein, ur: NEGATIVE mg/dL
Specific Gravity, Urine: 1.032 — ABNORMAL HIGH (ref 1.005–1.030)
pH: 6 (ref 5.0–8.0)

## 2019-11-20 LAB — COMPREHENSIVE METABOLIC PANEL
ALT: 40 U/L (ref 0–44)
AST: 27 U/L (ref 15–41)
Albumin: 4.3 g/dL (ref 3.5–5.0)
Alkaline Phosphatase: 53 U/L (ref 38–126)
Anion gap: 11 (ref 5–15)
BUN: 18 mg/dL (ref 6–20)
CO2: 22 mmol/L (ref 22–32)
Calcium: 9.1 mg/dL (ref 8.9–10.3)
Chloride: 104 mmol/L (ref 98–111)
Creatinine, Ser: 1.14 mg/dL (ref 0.61–1.24)
GFR calc Af Amer: >60 mL/min (ref >60)
GFR calc non Af Amer: >60 mL/min (ref >60)
Glucose, Bld: 94 mg/dL (ref 70–99)
Potassium: 3.8 mmol/L (ref 3.5–5.1)
Sodium: 137 mmol/L (ref 135–145)
Total Bilirubin: 1.3 mg/dL — ABNORMAL HIGH (ref 0.3–1.2)
Total Protein: 6.5 g/dL (ref 6.5–8.1)

## 2019-11-20 LAB — BLOOD GAS, ARTERIAL
Acid-Base Excess: 0.2 mmol/L (ref 0.0–2.0)
Bicarbonate: 24.2 mmol/L (ref 20.0–28.0)
Drawn by: 164
FIO2: 21
O2 Saturation: 98.1 %
Patient temperature: 37
pCO2 arterial: 38.4 mmHg (ref 32.0–48.0)
pH, Arterial: 7.417 (ref 7.350–7.450)
pO2, Arterial: 110 mmHg — ABNORMAL HIGH (ref 83.0–108.0)

## 2019-11-20 LAB — SARS CORONAVIRUS 2 (TAT 6-24 HRS): SARS Coronavirus 2: NEGATIVE

## 2019-11-20 LAB — CBC
HCT: 43.6 % (ref 39.0–52.0)
Hemoglobin: 15.2 g/dL (ref 13.0–17.0)
MCH: 31.3 pg (ref 26.0–34.0)
MCHC: 34.9 g/dL (ref 30.0–36.0)
MCV: 89.9 fL (ref 80.0–100.0)
Platelets: 222 10*3/uL (ref 150–400)
RBC: 4.85 MIL/uL (ref 4.22–5.81)
RDW: 12.1 % (ref 11.5–15.5)
WBC: 7.2 10*3/uL (ref 4.0–10.5)
nRBC: 0 % (ref 0.0–0.2)

## 2019-11-20 LAB — ABO/RH: ABO/RH(D): O POS

## 2019-11-20 LAB — TYPE AND SCREEN
ABO/RH(D): O POS
Antibody Screen: NEGATIVE

## 2019-11-20 LAB — HEMOGLOBIN A1C
Hgb A1c MFr Bld: 5.5 % (ref 4.8–5.6)
Mean Plasma Glucose: 111.15 mg/dL

## 2019-11-20 LAB — APTT: aPTT: 27 seconds (ref 24–36)

## 2019-11-20 LAB — SURGICAL PCR SCREEN
MRSA, PCR: NEGATIVE
Staphylococcus aureus: NEGATIVE

## 2019-11-20 MED ORDER — IOPAMIDOL (ISOVUE-370) INJECTION 76%
75.0000 mL | Freq: Once | INTRAVENOUS | Status: AC | PRN
  Administered 2019-11-20: 75 mL via INTRAVENOUS

## 2019-11-20 NOTE — Progress Notes (Signed)
PCP - Harrold Donath Cardiologist - Dr. Quay Burow  PPM/ICD - n/a Device Orders -  Rep Notified -   Chest x-ray - 11/20/2019 EKG - 11/20/2019 Stress Test - patient denies ECHO - 10/05/19 Cardiac Cath - 10/05/19  Sleep Study - patient denies CPAP -   Fasting Blood Sugar - n/a Checks Blood Sugar _____ times a day  Blood Thinner Instructions: n/a Aspirin Instructions: n/a  ERAS Protcol - n/a PRE-SURGERY Ensure or G2-   COVID TEST- after PAT appointment 11/20/19   Anesthesia review: yes, recent cardiac testing.  Patient denies shortness of breath, fever, cough and chest pain at PAT appointment   All instructions explained to the patient, with a verbal understanding of the material. Patient agrees to go over the instructions while at home for a better understanding. Patient also instructed to self quarantine after being tested for COVID-19. The opportunity to ask questions was provided.

## 2019-11-20 NOTE — Progress Notes (Signed)
[address]       [place] [address]             [phone]     CARDIOTHORACIC SURGERY OFFICE NOTE  Referring Provider is Lorretta Harp, MD PCP is Maximiano Coss, NP   HPI:  Kevin Medina is a 49 year old male with history of mitral valve prolapse who returns to the office today for follow-up of severe symptomatic primary mitral regurgitation with tentative plans to proceed with elective mitral valve repair later this week.  Kevin Medina was originally seen in consultation on October 09, 2019.  Kevin Medina reports no new problems or complaints over the last few weeks and is looking forward to proceeding with surgery as previously planned.   Current Outpatient Medications  Medication Sig Dispense Refill  . psyllium (METAMUCIL) 58.6 % packet Take 1 packet by mouth daily.    Marland Kitchen albuterol (VENTOLIN HFA) 108 (90 Base) MCG/ACT inhaler Inhale 2 puffs into the lungs every 6 (six) hours as needed for wheezing or shortness of breath. (Kevin Medina not taking: Reported on 11/20/2019) 18 g 1  . DIGESTIVE ENZYMES PO Take 1 tablet by mouth daily.     . Multiple Vitamin (MULTIVITAMIN) tablet Take 1 tablet by mouth daily.    . OMEGA-3 FATTY ACIDS PO Take 1,000 mg by mouth daily.     . Turmeric 500 MG CAPS Take 1,000 mg by mouth in the morning and at bedtime.      Current Facility-Administered Medications  Medication Dose Route Frequency Provider Last Rate Last Admin  . sodium chloride flush (NS) 0.9 % injection 3 mL  3 mL Intravenous Q12H Lorretta Harp, MD          Physical Exam:   BP 138/75   Pulse 73   Temp 97.7 F (36.5 C) (Skin)   Resp 20   Ht 5\' 5"  (1.651 m)   Wt 167 lb (75.8 kg)   SpO2 97% Comment: RA  BMI 27.79 kg/m   General:  Well-appearing  Chest:   Clear to auscultation  CV:   Regular rate and rhythm with systolic murmur  Incisions:  n/a  Abdomen:  Soft nontender  Extremities:  Warm and well-perfused  Diagnostic Tests:  CT ANGIOGRAPHY CHEST, ABDOMEN AND  PELVIS  TECHNIQUE: Non-contrast CT of the chest was initially obtained.  Multidetector CT imaging through the chest, abdomen and pelvis was performed using the standard protocol during bolus administration of intravenous contrast. Multiplanar reconstructed images and MIPs were obtained and reviewed to evaluate the vascular anatomy.  CONTRAST:  68mL ISOVUE-370 IOPAMIDOL (ISOVUE-370) INJECTION 76%  COMPARISON:  None.  FINDINGS: CTA CHEST FINDINGS  Cardiovascular: Heart is normal size. The aortic root at the sinuses of Valsalva measures maximally 4 cm. Ascending thoracic aorta 2.8 cm. Descending thoracic aorta 2.2 cm proximally. No evidence of aortic dissection. Scattered calcifications in the aortic arch.  Mediastinum/Nodes: No mediastinal, hilar, or axillary adenopathy. Trachea and esophagus are unremarkable. Thyroid unremarkable.  Lungs/Pleura: Biapical scarring. No confluent opacities or effusions.  Musculoskeletal: Chest wall soft tissues are unremarkable. No acute bony abnormality.  Review of the MIP images confirms the above findings.  CTA ABDOMEN AND PELVIS FINDINGS  VASCULAR  Aorta: Normal caliber aorta without aneurysm, dissection, vasculitis or significant stenosis.  Celiac: Patent without evidence of aneurysm, dissection, vasculitis or significant stenosis.  SMA: Patent without evidence of aneurysm, dissection, vasculitis or significant stenosis.  Renals: Both renal arteries are patent without evidence of aneurysm, dissection, vasculitis, fibromuscular  dysplasia or significant stenosis.  IMA: Patent without evidence of aneurysm, dissection, vasculitis or significant stenosis.  Inflow: Patent without evidence of aneurysm, dissection, vasculitis or significant stenosis.  Veins: No obvious venous abnormality within the limitations of this arterial phase study. Incidentally noted is a retroaortic left renal vein.  Review of the MIP  images confirms the above findings.  NON-VASCULAR  Hepatobiliary: No focal hepatic abnormality. Gallbladder unremarkable.  Pancreas: No focal abnormality or ductal dilatation.  Spleen: No focal abnormality.  Normal size.  Adrenals/Urinary Tract: 2.4 cm cyst in the midpole of the left kidney. 2.2 cm cyst in the upper pole of the right kidney. No hydronephrosis. Adrenal glands and urinary bladder unremarkable.  Stomach/Bowel: Stomach, large and small bowel grossly unremarkable.  Lymphatic: No adenopathy  Reproductive: No visible focal abnormality.  Other: No free fluid or free air.  Musculoskeletal: No acute bony abnormality.  Review of the MIP images confirms the above findings.  IMPRESSION: Slight dilatation of the aortic root measuring 4 cm maximally at the sinuses of Valsalva. No evidence of aortic dissection.  No acute findings in the chest, abdomen or pelvis.  Bilateral simple appearing renal cysts.   Electronically Signed   By: Rolm Baptise M.D.   On: 11/20/2019 10:15    Impression:  Kevin Medina has mitral valve prolapse with stage D severe symptomatic primary mitral regurgitation.  Kevin Medina describes a long history of palpitations that have increased in frequency and as well as a several month history of symptoms of exertional shortness of breath and decreased exercise tolerance.  This occurs in the setting of relatively young physically fit individual who exercises on a strenuous basis 6 days a week.  I have personally reviewed the Kevin Medina's recent transthoracic and transesophageal echocardiograms, diagnostic cardiac catheterization, and CT angiograms.  Echocardiograms demonstrate bileaflet prolapse with Barlow's type valve functional anatomy including severe prolapse involving multiple segments of the posterior leaflet.  There are multiple elongated primary chordae tendinae but I do not see any ruptured primary chordae tendinae nor clearly flail segments.   Quantification of the severity of mitral regurgitation was difficult on TEE due to the presence of multiple jets, but the severity was officially graded "moderate to severe".  There is mild left atrial enlargement.  Both transthoracic and transesophageal echocardiograms revealed significant left ventricular systolic dysfunction with ejection fraction estimated only 40 to 45% in the setting of severe mitral regurgitation.  Diagnostic cardiac catheterization is notable for the absence of significant coronary artery disease and revealed normal cardiac output and right heart pressures.  CT angiograms revealed no contraindication to peripheral cannulation for surgery.  I agree the Kevin Medina would best be treated with elective mitral valve repair.  Although the Kevin Medina does not have any clear flail segments and the severity of mitral regurgitation was somewhat difficult to quantify, the MR appears at least moderately severe and is associated with progressive symptoms and significant left ventricular systolic dysfunction.  Based upon review of the Kevin Medina's TEE I feel there is better than 98% likelihood of successful and durable valve repair associated with less than 1% risk of operative mortality.  The Kevin Medina appears to be a good candidate for minimally invasive approach for surgery.   Plan:  The Kevin Medina was again counseled at length regarding the indications, risks and potential benefits of mitral valve repair.  The rationale for elective surgery has been explained, including a comparison between surgery and continued medical therapy with close follow-up.  The likelihood of successful and durable mitral valve repair  has been discussed with particular reference to the findings of their recent echocardiogram.  Based upon these findings and previous experience, I have quoted them a greater than 98 percent likelihood of successful valve repair with less than 1 percent risk of mortality or major morbidity.   Alternative surgical approaches have been discussed including a comparison between conventional sternotomy and minimally-invasive techniques.  The relative risks and benefits of each have been reviewed as they pertain to the Kevin Medina's specific circumstances, and expectations for the Kevin Medina's postoperative convalescence has been discussed.     The Kevin Medina understands and accepts all potential risks of surgery including but not limited to risk of death, stroke or other neurologic complication, myocardial infarction, congestive heart failure, respiratory failure, renal failure, bleeding requiring transfusion and/or reexploration, arrhythmia, infection or other wound complications, pneumonia, pleural and/or pericardial effusion, pulmonary embolus, aortic dissection or other major vascular complication, or delayed complications related to valve repair or replacement including but not limited to structural valve deterioration and failure, thrombosis, embolization, endocarditis, or paravalvular leak.  Specific risks potentially related to the minimally-invasive approach were discussed at length, including but not limited to risk of conversion to full or partial sternotomy, aortic dissection or other major vascular complication, unilateral acute lung injury or pulmonary edema, phrenic nerve dysfunction or paralysis, rib fracture, chronic pain, lung hernia, or lymphocele. All of their questions have been answered.   I spent in excess of 15 minutes during the conduct of this office consultation and >50% of this time involved direct face-to-face encounter with the Kevin Medina for counseling and/or coordination of their care.    Valentina Gu. Roxy Manns, MD 11/20/2019 12:24 PM

## 2019-11-20 NOTE — Patient Instructions (Signed)
   Have nothing to eat or drink after midnight the night before surgery.  On the morning of surgery do not take any medications.   

## 2019-11-21 MED ORDER — TRANEXAMIC ACID 1000 MG/10ML IV SOLN
1.5000 mg/kg/h | INTRAVENOUS | Status: DC
  Filled 2019-11-21: qty 25

## 2019-11-21 MED ORDER — SODIUM CHLORIDE 0.9 % IV SOLN
750.0000 mg | INTRAVENOUS | Status: DC
  Filled 2019-11-21 (×3): qty 750

## 2019-11-21 MED ORDER — TRANEXAMIC ACID (OHS) BOLUS VIA INFUSION
15.0000 mg/kg | INTRAVENOUS | Status: AC
  Administered 2019-11-22: 1137 mg via INTRAVENOUS
  Filled 2019-11-21: qty 1137

## 2019-11-21 MED ORDER — GLUTARALDEHYDE 0.625% SOAKING SOLUTION
TOPICAL | Status: DC
  Filled 2019-11-21: qty 50

## 2019-11-21 MED ORDER — POTASSIUM CHLORIDE 2 MEQ/ML IV SOLN
80.0000 meq | INTRAVENOUS | Status: DC
  Filled 2019-11-21: qty 40

## 2019-11-21 MED ORDER — INSULIN REGULAR(HUMAN) IN NACL 100-0.9 UT/100ML-% IV SOLN
INTRAVENOUS | Status: DC
  Filled 2019-11-21: qty 100

## 2019-11-21 MED ORDER — VANCOMYCIN HCL 1000 MG IV SOLR
INTRAVENOUS | Status: DC
  Filled 2019-11-21: qty 1000

## 2019-11-21 MED ORDER — TRANEXAMIC ACID (OHS) PUMP PRIME SOLUTION
2.0000 mg/kg | INTRAVENOUS | Status: DC
  Filled 2019-11-21: qty 1.52

## 2019-11-21 MED ORDER — PHENYLEPHRINE HCL-NACL 20-0.9 MG/250ML-% IV SOLN
30.0000 ug/min | INTRAVENOUS | Status: DC
  Filled 2019-11-21: qty 250

## 2019-11-21 MED ORDER — MANNITOL 20 % IV SOLN
Freq: Once | INTRAVENOUS | Status: DC
  Filled 2019-11-21: qty 13

## 2019-11-21 MED ORDER — PLASMA-LYTE 148 IV SOLN
INTRAVENOUS | Status: DC
  Filled 2019-11-21: qty 2.5

## 2019-11-21 MED ORDER — SODIUM CHLORIDE 0.9 % IV SOLN
INTRAVENOUS | Status: DC
  Filled 2019-11-21: qty 30

## 2019-11-21 MED ORDER — EPINEPHRINE HCL 5 MG/250ML IV SOLN IN NS
0.0000 ug/min | INTRAVENOUS | Status: DC
  Filled 2019-11-21: qty 250

## 2019-11-21 MED ORDER — SODIUM CHLORIDE 0.9 % IV SOLN
1.5000 g | INTRAVENOUS | Status: AC
  Administered 2019-11-22 (×2): 1.5 g via INTRAVENOUS
  Filled 2019-11-21: qty 1.5

## 2019-11-21 MED ORDER — DEXMEDETOMIDINE HCL IN NACL 400 MCG/100ML IV SOLN
0.1000 ug/kg/h | INTRAVENOUS | Status: AC
  Administered 2019-11-22: .2 ug/kg/h via INTRAVENOUS
  Filled 2019-11-21: qty 100

## 2019-11-21 MED ORDER — MILRINONE LACTATE IN DEXTROSE 20-5 MG/100ML-% IV SOLN
0.3000 ug/kg/min | INTRAVENOUS | Status: DC
  Filled 2019-11-21: qty 100

## 2019-11-21 MED ORDER — VANCOMYCIN HCL 1250 MG/250ML IV SOLN
1250.0000 mg | INTRAVENOUS | Status: AC
  Administered 2019-11-22 (×2): 1250 mg via INTRAVENOUS
  Filled 2019-11-21: qty 250

## 2019-11-21 MED ORDER — NITROGLYCERIN IN D5W 200-5 MCG/ML-% IV SOLN
2.0000 ug/min | INTRAVENOUS | Status: DC
  Filled 2019-11-21: qty 250

## 2019-11-21 MED ORDER — NOREPINEPHRINE 4 MG/250ML-% IV SOLN
0.0000 ug/min | INTRAVENOUS | Status: DC
  Filled 2019-11-21: qty 250

## 2019-11-21 NOTE — H&P (Signed)
[address]       [place] [address]             [phone]          CARDIOTHORACIC SURGERY HISTORY AND PHYSICAL EXAM  Referring Provider is Lorretta Harp, MD PCP is Maximiano Coss, NP      Chief Complaint  Patient presents with  . Mitral Regurgitation    Surgical eval, Cardiac Cath and TEE 10/05/19, ECHO 09/05/19  . Mitral Valve Prolapse    HPI:  Patient is a 49 year old male with mitral valve prolapse who has been referred for surgical consultation to discuss treatment options for management of severe symptomatic primary mitral regurgitation.  Patient states that he was first noted to have mitral valve prolapse approximately 15 years ago when he underwent an echocardiogram as part of the work-up for symptoms of palpitations.  He was told at the time that the valve is not leaking much and he did not need surgical intervention but that eventually it might progress.  He has not had regular follow-up with a cardiologist until recently.  Patient developed COVID-19 infection in October 2020.  Symptoms included fever, body aches, and headaches which lasted for approximately a day and a half and loss of taste and smell which recovered within 6 or 7 days.  After his recovery he experienced significant exertional shortness of breath with initial attempt to return to exercising on a regular basis, and he states that it took approximately a month for him to get back to his baseline exercise tolerance.  Over the past 6 months he has noticed more more symptoms of exertional shortness of breath and occasional tachypalpitations.  He exercises religiously 6 days a week including strenuous high intensity training and running on a regular basis.  He has noticed a significant change in his exercise tolerance and he occasionally gets palpitations that are usually transient and without associated dizziness.  He was seen in follow-up by Maximiano Coss, NP who noticed a prominent  systolic murmur on exam and referred the patient to Dr. Quay Burow for formal cardiology consultation.  Transthoracic echocardiogram revealed bileaflet mitral valve prolapse with at least moderate mitral regurgitation and abnormal left ventricular systolic function, ejection fraction estimated 40%.  TEE confirmed the presence of bileaflet prolapse with "moderate to severe" mitral regurgitation.  Left ventricular ejection fraction was estimated 40 to 45%.  There was normal right ventricular size and function.  There was mild left atrial enlargement.  Left and right heart catheterization revealed normal coronary artery anatomy with no significant coronary artery disease and normal right heart pressures.  Cardiothoracic surgical consultation was requested.  Patient is married and lives locally in [place] with his wife.  He works full-time as an Systems developer.  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  He exercises 6 days a week including very strenuous activities including high intensity training, CrossFit, and running.  He states that he has definitely noticed a significant changes in his exercise tolerance and in particular that he gets short of breath more easily with strenuous exertion.  He denies resting shortness of breath, PND, orthopnea, or lower extremity edema.  He reports frequent palpitations without any history of dizziness or syncope.  Patient was originally seen in consultation on October 09, 2019.  He reports no new problems or complaints over the last few weeks and is looking forward to proceeding with surgery as previously planned.  Past Medical  History:  Diagnosis Date  . Allergy   . Arrhythmia   . Exercise-induced asthma   . Heart murmur    due to MVP   . Hyperlipidemia   . Mitral regurgitation   . Mitral valve prolapse     Past Surgical History:  Procedure Laterality Date  . COLONOSCOPY    . POLYPECTOMY  15 yrs ago    ? type  of polyps per pt   . RIGHT/LEFT HEART CATH AND CORONARY ANGIOGRAPHY N/A 10/05/2019   Procedure: RIGHT/LEFT HEART CATH AND CORONARY ANGIOGRAPHY;  Surgeon: Lorretta Harp, MD;  Location: [facility];  Service: Cardiovascular;  Laterality: N/A;  . TEE WITHOUT CARDIOVERSION N/A 10/05/2019   Procedure: TRANSESOPHAGEAL ECHOCARDIOGRAM (TEE);  Surgeon: Lelon Perla, MD;  Location: [facility];  Service: Cardiovascular;  Laterality: N/A;  . VASECTOMY      Family History  Problem Relation Age of Onset  . Cancer Mother   . Healthy Mother   . Breast cancer Mother   . Hyperlipidemia Father   . Hypertension Father   . Healthy Sister   . Cancer Maternal Grandmother   . Stroke Maternal Grandfather   . Hyperlipidemia Paternal Grandfather   . Colon polyps Maternal Uncle   . Colon cancer Neg Hx   . Esophageal cancer Neg Hx   . Rectal cancer Neg Hx   . Stomach cancer Neg Hx     Social History Social History   Tobacco Use  . Smoking status: Former Smoker    Quit date: 06/15/1997    Years since quitting: 22.4  . Smokeless tobacco: Former Network engineer Use Topics  . Alcohol use: Yes    Alcohol/week: 10.0 standard drinks    Types: 10 Standard drinks or equivalent per week    Comment: social- none since jan 08-2019  . Drug use: Never    Prior to Admission medications   Medication Sig Start Date End Date Taking? Authorizing Provider  albuterol (VENTOLIN HFA) 108 (90 Base) MCG/ACT inhaler Inhale 2 puffs into the lungs every 6 (six) hours as needed for wheezing or shortness of breath. Patient not taking: Reported on 11/20/2019 07/14/19  Yes Maximiano Coss, NP  DIGESTIVE ENZYMES PO Take 1 tablet by mouth daily.    Yes [provider]  Multiple Vitamin (MULTIVITAMIN) tablet Take 1 tablet by mouth daily.   Yes [provider]  OMEGA-3 FATTY ACIDS PO Take 1,000 mg by mouth daily.    Yes [provider]  psyllium (METAMUCIL) 58.6 % packet Take 1 packet by mouth  daily.   Yes [provider]  Turmeric 500 MG CAPS Take 1,000 mg by mouth in the morning and at bedtime.    Yes [provider]    No Known Allergies   Review of Systems:              General:                      normal appetite, normal energy, no weight gain, no weight loss, no fever             Cardiac:                       no chest pain with exertion, no chest pain at rest, +SOB with exertion, no resting SOB, no PND, no orthopnea, + palpitations, no arrhythmia, no atrial fibrillation, no LE edema, no dizzy spells, no syncope  Respiratory:                 no shortness of breath, no home oxygen, no productive cough, no dry cough, no bronchitis, no wheezing, no hemoptysis, no asthma, no pain with inspiration or cough, no sleep apnea, no CPAP at night             GI:                               no difficulty swallowing, no reflux, no frequent heartburn, no hiatal hernia, no abdominal pain, no constipation, no diarrhea, no hematochezia, no hematemesis, no melena             GU:                              no dysuria,  no frequency, no urinary tract infection, no hematuria, no enlarged prostate, no kidney stones, no kidney disease             Vascular:                     no pain suggestive of claudication, no pain in feet, no leg cramps, no varicose veins, no DVT, no non-healing foot ulcer             Neuro:                         no stroke, no TIA's, no seizures, no headaches, no temporary blindness one eye,  no slurred speech, no peripheral neuropathy, no chronic pain, no instability of gait, no memory/cognitive dysfunction             Musculoskeletal:         no arthritis, no joint swelling, no myalgias, no difficulty walking, normal mobility              Skin:                            no rash, no itching, no skin infections, no pressure sores or ulcerations             Psych:                         no anxiety, no depression, no nervousness, no unusual  recent stress             Eyes:                           no blurry vision, no floaters, no recent vision changes, + wears glasses or contacts             ENT:                            no hearing loss, no loose or painful teeth, no dentures, last saw dentist January 2021             Hematologic:               no easy bruising, no abnormal bleeding, no clotting disorder, no frequent epistaxis             Endocrine:  no diabetes, does not check CBG's at home                           Physical Exam:              BP 122/74 (BP Location: Right Arm, Patient Position: Sitting, Cuff Size: Large)   Pulse 76   Temp 97.7 F (36.5 C)   Resp 16   Ht 5\' 5"  (1.651 m)   Wt 165 lb (74.8 kg)   SpO2 100% Comment: RA  BMI 27.46 kg/m              General:                      Thin, fit,  well-appearing             HEENT:                       Unremarkable              Neck:                           no JVD, no bruits, no adenopathy              Chest:                          clear to auscultation, symmetrical breath sounds, no wheezes, no rhonchi              CV:                              RRR, grade IV/VI late systolic murmur              Abdomen:                    soft, non-tender, no masses              Extremities:                 warm, well-perfused, pulses palpable, no LE edema             Rectal/GU                   Deferred             Neuro:                         Grossly non-focal and symmetrical throughout             Skin:                            Clean and dry, no rashes, no breakdown   Diagnostic Tests:  ECHOCARDIOGRAM REPORT       Patient Name:  MARIUS BETTS Date of Exam: 09/01/2019  Medical Rec #: [id number]   Height:    65.0 in  Accession #:  [id number]   Weight:    168.0 lb  Date of Birth: 1970/11/26   BSA:     1.837 m  Patient Age:  48 years    BP:      130/78 mmHg  Patient Gender: M  HR:       66 bpm.  Exam Location: [facility]   Procedure: 2D Echo, 3D Echo, Cardiac Doppler, Color Doppler and Strain  Analysis                 MODIFIED REPORT:  This report was modified by Dorris Carnes MD on 09/01/2019 due to To complete.  Indications:   I34.1 Mitral valve prolapse    History:     Patient has no prior history of Echocardiogram  examinations.          Mitral Valve Prolapse, Signs/Symptoms:Dyspnea; Risk          Factors:Former Smoker. COVID-19+ 03/2019.    Sonographer:   Jessee Avers, RDCS  Referring Phys: [place] Phys: Dorris Carnes MD   IMPRESSIONS    1. Left ventricular ejection fraction, by estimation, is 40%%. The left  ventricle has normal function. The left ventricle demonstrates regional  wall motion abnormalities (see scoring diagram/findings for description).  Left ventricular diastolic  parameters are consistent with Grade II diastolic dysfunction  (pseudonormalization). The average left ventricular global longitudinal  strain is -15.3 %.  2. Right ventricular systolic function is mildly reduced. The right  ventricular size is normal.  3. The mitral valve is abnormal. Moderate mitral valve  regurgitation.Bileaflet prolapse of MV  4. The aortic valve is normal in structure. Aortic valve regurgitation is  trivial.  5. Aortic dilatation noted. There is borderline dilatation of the aortic  root measuring 40 mm.  6. Left atrial size was mildly dilated.  7. The inferior vena cava is normal in size with greater than 50%  respiratory variability, suggesting right atrial pressure of 3 mmHg.   FINDINGS  Left Ventricle: Left ventricular ejection fraction, by estimation, is  40%%. The left ventricle has normal function. The left ventricle  demonstrates regional wall motion abnormalities. The average left  ventricular global longitudinal strain is -15.3 %.  The left ventricular internal  cavity size was normal in size. There is no  left ventricular hypertrophy. Left ventricular diastolic parameters are  consistent with Grade II diastolic dysfunction (pseudonormalization).   Right Ventricle: The right ventricular size is normal. No increase in  right ventricular wall thickness. Right ventricular systolic function is  mildly reduced.   Left Atrium: Left atrial size was mildly dilated.   Right Atrium: Right atrial size was normal in size.   Pericardium: There is no evidence of pericardial effusion.   Mitral Valve: The mitral valve is abnormal. There is moderate prolapse of  both leaflets of the mitral valve. Moderate to severe mitral valve  regurgitation. Mild to moderate mitral valve stenosis.   Tricuspid Valve: The tricuspid valve is normal in structure. Tricuspid  valve regurgitation is trivial.   Aortic Valve: The aortic valve is normal in structure. Aortic valve  regurgitation is trivial.   Pulmonic Valve: The pulmonic valve was normal in structure. Pulmonic valve  regurgitation is trivial.   Aorta: Aortic dilatation noted. There is borderline dilatation of the  aortic root measuring 40 mm.   Venous: The inferior vena cava is normal in size with greater than 50%  respiratory variability, suggesting right atrial pressure of 3 mmHg.   IAS/Shunts: No atrial level shunt detected by color flow Doppler.     LEFT VENTRICLE  PLAX 2D  LVIDd:     5.20 cm Diastology  LVIDs:     2.90 cm LV e' lateral:  6.09 cm/s  LV PW:  1.00 cm LV E/e' lateral: 10.3  LV IVS:    1.00 cm LV e' medial:  5.33 cm/s  LVOT diam:   2.20 cm LV E/e' medial: 11.8  LV SV:     73  LV SV Index:  40    2D Longitudinal Strain  LVOT Area:   3.80 cm 2D Strain GLS (A2C):  -14.5 %             2D Strain GLS (A3C):  -17.0 %             2D Strain GLS (A4C):  -14.3 %             2D Strain GLS Avg:   -15.3 %                3D Volume EF:             3D EF:    56 %             LV EDV:    188 ml             LV ESV:    84 ml             LV SV:    105 ml   RIGHT VENTRICLE  RV Basal diam: 3.40 cm  RV S prime:   8.49 cm/s  TAPSE (M-mode): 2.8 cm   LEFT ATRIUM       Index    RIGHT ATRIUM      Index  LA diam:    3.60 cm 1.96 cm/m RA Pressure: 3.00 mmHg  LA Vol (A2C):  72.8 ml 39.63 ml/m RA Area:   16.10 cm  LA Vol (A4C):  63.2 ml 34.41 ml/m RA Volume:  38.10 ml 20.74 ml/m  LA Biplane Vol: 67.3 ml 36.64 ml/m  AORTIC VALVE  LVOT Vmax:  96.10 cm/s  LVOT Vmean: 63.500 cm/s  LVOT VTI:  0.193 m    AORTA  Ao Root diam: 4.00 cm  Ao Asc diam: 3.20 cm   MITRAL VALVE        TRICUSPID VALVE               Estimated RAP: 3.00 mmHg    MV E velocity: 62.90 cm/s SHUNTS  MV A velocity: 50.30 cm/s Systemic VTI: 0.19 m  MV E/A ratio: 1.25    Systemic Diam: 2.20 cm   Dorris Carnes MD  Electronically signed by Dorris Carnes MD  Signature Date/Time: 09/01/2019/11:33:31 PM      TRANSESOPHOGEAL ECHO REPORT       Patient Name:  Kathi Simpers Date of Exam: 10/05/2019  Medical Rec #: [id number]    Height:    65.0 in  Accession #:  [id number]    Weight:    167.8 lb  Date of Birth: Apr 28, 1971    BSA:     1.836 m  Patient Age:  29 years     BP:      123/75 mmHg  Patient Gender: M        HR:      65 bpm.  Exam Location: Inpatient   Procedure: Transesophageal Echo, Cardiac Doppler, Color Doppler and 3D  Echo   Indications:   Mitral valve prolapse    History:     Patient has prior history of Echocardiogram examinations,  most          recent 09/05/2019. Mitral Valve Disease and Mitral Valve  Prolapse; Signs/Symptoms:Dyspnea.    Sonographer:   Dustin Flock  Referring Phys:  [place]  Diagnosing Phys: Kirk Ruths MD   PROCEDURE: The transesophogeal probe was passed without difficulty through  the esophogus of the patient. Sedation performed by performing physician.  The patient was monitored while under deep sedation. Anesthestetic  sedation was provided intravenously by  Anesthesiology: 260mg  of Propofol. The patient developed no complications  during the procedure.   IMPRESSIONS    1. Anteroapical hypokinesis with overall mild to moderate LV dysfunction;  mildly dilated ascending aorta; bileaflet MVP (posterior > anterior) with  moderate to severe MR (2 jets; difficult to quantitate).  2. Left ventricular ejection fraction, by estimation, is 40 to 45%. The  left ventricle has mildly decreased function. The left ventricle  demonstrates regional wall motion abnormalities (see scoring  diagram/findings for description).  3. Right ventricular systolic function is normal. The right ventricular  size is normal.  4. Left atrial size was mildly dilated. No left atrial/left atrial  appendage thrombus was detected.  5. The mitral valve is abnormal. Moderate to severe mitral valve  regurgitation.  6. The aortic valve is tricuspid. Aortic valve regurgitation is not  visualized.  7. Aortic dilatation noted. There is mild dilatation of the aortic root  measuring 38 mm. There is mild (Grade II) plaque involving the descending  aorta.   FINDINGS  Left Ventricle: Left ventricular ejection fraction, by estimation, is 40  to 45%. The left ventricle has mildly decreased function. The left  ventricle demonstrates regional wall motion abnormalities. The left  ventricular internal cavity size was normal  in size. There is no left ventricular hypertrophy.   Right Ventricle: The right ventricular size is normal.Right ventricular  systolic function is normal.   Left Atrium: Left atrial size was mildly dilated. No left atrial/left  atrial  appendage thrombus was detected.   Right Atrium: Right atrial size was normal in size.   Pericardium: There is no evidence of pericardial effusion.   Mitral Valve: The mitral valve is abnormal. There is moderate prolapse of  both leaflets of the mitral valve. Moderate to severe mitral valve  regurgitation.   Tricuspid Valve: The tricuspid valve is normal in structure. Tricuspid  valve regurgitation is mild.   Aortic Valve: The aortic valve is tricuspid. Aortic valve regurgitation is  not visualized.   Pulmonic Valve: The pulmonic valve was normal in structure. Pulmonic valve  regurgitation is trivial.   Aorta: Aortic dilatation noted. There is mild dilatation of the aortic  root measuring 38 mm. There is mild (Grade II) plaque involving the  descending aorta.   IAS/Shunts: The interatrial septum is aneurysmal. No atrial level shunt  detected by color flow Doppler.   Additional Comments: Anteroapical hypokinesis with overall mild to  moderate LV dysfunction; mildly dilated ascending aorta; bileaflet MVP  (posterior > anterior) with moderate to severe MR (2 jets; difficult to  quantitate).   Kirk Ruths MD  Electronically signed by Kirk Ruths MD  Signature Date/Time: 10/05/2019/1:33:15 PM     RIGHT/LEFT HEART CATH AND CORONARY ANGIOGRAPHY  Conclusion    Hemodynamic findings consistent with mitral valve regurgitation.  Marckus Hanover Balkindis a 49 y.o.male   [id number] LOCATION: FACILITY: [facility]  PHYSICIAN: Quay Burow, M.D. [birth year]   DATE OF PROCEDURE: 10/05/2019  DATE OF DISCHARGE:     CARDIAC CATHETERIZATION    History obtained from chart review.Brayant Balkindis a 49 y.o.thin and fit appearing married Caucasian male father of one 54 year old  daughter who works as an Systems developer. He was referred by Maximiano Coss, NP for evaluation of dyspnea and mitral valve prolapse.I last saw him in the office 07/21/2019.He basically  has no cardiac risk factors. 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 He did have a 2D echo and GXT back in [place] 15 years ago and was told he had mitral valve prolapse. He was a Wellsite geologist at the time. He contracted COVID-19 in late October with fever for a day and a half, body aches, headaches and loss of taste and smell for 6 days but recovered. He is noticed recently when he works out that he is more fatigued and shortness of breath than he usually is. His PCP noticed a late systolic click and murmur.  Since I saw him last he has had a 2D echo performed 09/01/2019 revealed an EF of 40% with normal LV size. He has grade 2 diastolic dysfunction, bilateral mitral valve prolapse with moderate to severe mitral regurgitation. He is functionally limited and has noticed increasing shortness of breath on exertion. He will need right left heart cath, transesophageal echo and referral to Dr. Vito Berger consideration of minimally invasive mitral valve repair.    IMPRESSION:Mr. Milby and has normal coronary arteries and normal filling pressures. He did have a transesophageal echo performed earlier this morning revealing severe bileaflet prolapse and moderate to severe MR with moderate LV dysfunction. He will be discharged home later this afternoon as an outpatient. We will make an appointment for him to see Dr. Lilly Cove, cardiothoracic surgeon, as an outpatient in the very near future.  Quay Burow. MD, [facility] Suite 10/05/2019 3:32 PM      Indications  Severe mitral regurgitation [I34.0 (ICD-10-CM)]  Nonischemic cardiomyopathy ([place]) [I42.8 (ICD-10-CM)]  Procedural Details  Technical Details PROCEDURE DESCRIPTION:   The patient was brought to the second floor [facility] cath lab in the postabsorptive state. He was premedicated with IV Versed and fentanyl. His right wrist and antecubital fossa as well as right groinWere prepped and shaved in usual sterile fashion.  Xylocaine 1% was used for local anesthesia. A 5 French sheath was inserted into the right common femoralartery using standard Seldinger technique. A 5 French sheath was inserted into the right antecubital vein. 5 French right left Judkins diagnostic catheters on 5 French pigtail catheter were used for selective coronary angiography, obtain left heart pressures and right heart cath using a 5 [place] Swan-Ganz catheter obtaining sequential pressures and blood samples for the determination of Fick cardiac output. Isovue dye was used for the entirety of the case. Retrograde aortic, left ventricular pullback pressures were recorded. I did attempt to access the right radial artery and got flashback 4 times but was unable to advance the wire.  Estimated blood loss <50 mL.   During this procedure medications were administered to achieve and maintain moderate conscious sedation while the patient's heart rate, blood pressure, and oxygen saturation were continuously monitored and I was present face-to-face 100% of this time.  Medications (Filter: Administrations occurring from 10/05/19 1427 to 10/05/19 1532) midazolam (VERSED) injection (mg) Total dose:  0.5 mg Date/Time  Rate/Dose/Volume Action  10/05/19 1441  0.5 mg Given    fentaNYL (SUBLIMAZE) injection (mcg) Total dose:  25 mcg Date/Time  Rate/Dose/Volume Action  10/05/19 1441  25 mcg Given    Heparin (Porcine) in NaCl 1000-0.9 UT/500ML-% SOLN (mL) Total volume:  1,000 mL Date/Time  Rate/Dose/Volume Action  10/05/19 1443  500 mL Given  1443  500 mL Given    lidocaine (PF) (XYLOCAINE) 1 % injection (mL) Total volume:  17 mL Date/Time  Rate/Dose/Volume Action  10/05/19 1446  2 mL Given  1505  15 mL Given    iohexol (OMNIPAQUE) 350 MG/ML injection (mL) Total volume:  35 mL Date/Time  Rate/Dose/Volume Action  10/05/19 1526  35 mL Given    Sedation Time  Sedation Time Physician-1: 36 minutes 24 seconds   Contrast  Medication Name Total Dose  iohexol (OMNIPAQUE) 350 MG/ML injection 35 mL    Radiation/Fluoro  Fluoro time: 3.5 (min) DAP: 10626 (mGycm2) Cumulative Air Kerma: 148 (mGy)  Coronary Findings  Diagnostic Dominance: Right No diagnostic findings have been documented. Intervention  No interventions have been documented. Right Heart  Right Heart Pressures Hemodynamic findings consistent with mitral valve regurgitation. Right atrial pressure-4/3 Right ventricular pressure-19/0 Pulmonary artery pressure-17/5, mean 10 Pulmonary wedge pressure-A-wave 8, V wave 6, mean 5 LVEDP-14 Cardiac output-5.87 L/min with an index of 3.24 L/min/m by Fick  Coronary Diagrams  Diagnostic Dominance: Right  Intervention  Implants      No implant documentation for this case.  Syngo Images  Show images for CARDIAC CATHETERIZATION  Images on Long Term Storage  Show images for Travon, Crochet "Rob"   Link to Procedure Log  Procedure Log    Hemo Data   Most Recent Value  Fick Cardiac Output 5.87 L/min  Fick Cardiac Output Index 3.24 (L/min)/BSA  RA A Wave 4 mmHg  RA V Wave 3 mmHg  RA Mean 2 mmHg  RV Systolic Pressure 19 mmHg  RV Diastolic Pressure 0 mmHg  RV EDP 3 mmHg  PA Systolic Pressure 17 mmHg  PA Diastolic Pressure 5 mmHg  PA Mean 10 mmHg  PW A Wave 8 mmHg  PW V Wave 6 mmHg  PW Mean 5 mmHg  AO Systolic Pressure 185 mmHg  AO Diastolic Pressure 67 mmHg  AO Mean 98 mmHg  LV Systolic Pressure 631 mmHg  LV Diastolic Pressure 4 mmHg  LV EDP 14 mmHg  AOp Systolic Pressure 497 mmHg  AOp Diastolic Pressure 76 mmHg  AOp Mean Pressure 99 mmHg  LVp Systolic Pressure 026 mmHg  LVp Diastolic Pressure 1 mmHg  LVp EDP Pressure 11 mmHg  QP/QS 1  TPVR Index 3.09 HRUI  TSVR Index 30.29 HRUI  PVR SVR Ratio 0.05  TPVR/TSVR Ratio 0.1     CT ANGIOGRAPHY CHEST, ABDOMEN AND PELVIS  TECHNIQUE: Non-contrast CT of the chest was initially  obtained.  Multidetector CT imaging through the chest, abdomen and pelvis was performed using the standard protocol during bolus administration of intravenous contrast. Multiplanar reconstructed images and MIPs were obtained and reviewed to evaluate the vascular anatomy.  CONTRAST: 29mL ISOVUE-370 IOPAMIDOL (ISOVUE-370) INJECTION 76%  COMPARISON: None.  FINDINGS: CTA CHEST FINDINGS  Cardiovascular: Heart is normal size. The aortic root at the sinuses of Valsalva measures maximally 4 cm. Ascending thoracic aorta 2.8 cm. Descending thoracic aorta 2.2 cm proximally. No evidence of aortic dissection. Scattered calcifications in the aortic arch.  Mediastinum/Nodes: No mediastinal, hilar, or axillary adenopathy. Trachea and esophagus are unremarkable. Thyroid unremarkable.  Lungs/Pleura: Biapical scarring. No confluent opacities or effusions.  Musculoskeletal: Chest wall soft tissues are unremarkable. No acute bony abnormality.  Review of the MIP images confirms the above findings.  CTA ABDOMEN AND PELVIS FINDINGS  VASCULAR  Aorta: Normal caliber aorta without aneurysm, dissection, vasculitis or significant stenosis.  Celiac: Patent without evidence of aneurysm, dissection, vasculitis or significant stenosis.  SMA: Patent without evidence of aneurysm, dissection, vasculitis or significant stenosis.  Renals: Both renal arteries are patent without evidence of aneurysm, dissection, vasculitis, fibromuscular dysplasia or significant stenosis.  IMA: Patent without evidence of aneurysm, dissection, vasculitis or significant stenosis.  Inflow: Patent without evidence of aneurysm, dissection, vasculitis or significant stenosis.  Veins: No obvious venous abnormality within the limitations of this arterial phase study. Incidentally noted is a retroaortic left renal vein.  Review of the MIP images confirms the above  findings.  NON-VASCULAR  Hepatobiliary: No focal hepatic abnormality. Gallbladder unremarkable.  Pancreas: No focal abnormality or ductal dilatation.  Spleen: No focal abnormality. Normal size.  Adrenals/Urinary Tract: 2.4 cm cyst in the midpole of the left kidney. 2.2 cm cyst in the upper pole of the right kidney. No hydronephrosis. Adrenal glands and urinary bladder unremarkable.  Stomach/Bowel: Stomach, large and small bowel grossly unremarkable.  Lymphatic: No adenopathy  Reproductive: No visible focal abnormality.  Other: No free fluid or free air.  Musculoskeletal: No acute bony abnormality.  Review of the MIP images confirms the above findings.  IMPRESSION: Slight dilatation of the aortic root measuring 4 cm maximally at the sinuses of Valsalva. No evidence of aortic dissection.  No acute findings in the chest, abdomen or pelvis.  Bilateral simple appearing renal cysts.   Electronically Signed By: Rolm Baptise M.D. On: 11/20/2019 10:15    Impression:  Patient has mitral valve prolapse with stage D severe symptomatic primary mitral regurgitation. He describes a long history of palpitations that have increased in frequency and as well as a several month history of symptoms of exertional shortness of breath and decreased exercise tolerance. This occurs in the setting of relatively young physically fit individual who exercises on a strenuous basis 6 days a week.  I have personally reviewed the patient's recent transthoracic and transesophageal echocardiograms, diagnostic cardiac catheterization, and CT angiograms. Echocardiograms demonstrate bileaflet prolapse with Barlow's type valve functional anatomy including severe prolapse involving multiple segments of the posterior leaflet. There are multiple elongated primary chordae tendinaebut I do not see any ruptured primary chordae tendinae norclearly flail segments. Quantification of the  severity of mitral regurgitation was difficult on TEE due to the presence of multiple jets,but the severity was officially graded "moderate to severe". There is mild left atrial enlargement. Both transthoracic and transesophageal echocardiograms revealed significant left ventricular systolic dysfunction with ejection fraction estimated only 40 to 45% in the setting of severe mitral regurgitation. Diagnostic cardiac catheterization is notable for the absence of significant coronary artery disease and revealed normal cardiac output and right heart pressures.  CT angiograms revealed no contraindication to peripheral cannulation for surgery.  I agree the patient would best be treated with elective mitral valve repair. Although the patient does not have any clear flail segments and the severity of mitral regurgitation was somewhat difficult to quantify, the MR appears at least moderately severe and is associated with progressive symptoms and significant left ventricular systolic dysfunction. Based upon review of the patient's TEE I feel there is better than 98% likelihood of successful and durable valve repair associated with less than 1% risk of operative mortality. The patient appears to be a good candidate for minimally invasive approach for surgery.   Plan:  The patientwas againcounseled at length regarding the indications, risks and potential benefits of mitral valve repair. The rationale for elective surgery has been explained, including a comparison between surgery and continued medical therapy with close follow-up. The likelihood of successful and durable mitral valve  repair has been discussed with particular reference to the findings of their recent echocardiogram. Based upon these findings and previous experience, I have quoted them a greater than 98percent likelihood of successful valve repair with less than 1percent risk of mortality or major morbidity. Alternative surgical  approaches have been discussed including a comparison between conventional sternotomy and minimally-invasive techniques. The relative risks and benefits of each have been reviewed as they pertain to the patient's specific circumstances, and expectations for the patient's postoperative convalescence has been discussed.    The patient understands and accepts all potential risks of surgery including but not limited to risk of death, stroke or other neurologic complication, myocardial infarction, congestive heart failure, respiratory failure, renal failure, bleeding requiring transfusion and/or reexploration, arrhythmia, infection or other wound complications, pneumonia, pleural and/or pericardial effusion, pulmonary embolus, aortic dissection or other major vascular complication, or delayed complications related to valve repair or replacement including but not limited to structural valve deterioration and failure, thrombosis, embolization, endocarditis, or paravalvular leak.  Specific risks potentially related to the minimally-invasive approach were discussed at length, including but not limited to risk of conversion to full or partial sternotomy, aortic dissection or other major vascular complication, unilateral acute lung injury or pulmonary edema, phrenic nerve dysfunction or paralysis, rib fracture, chronic pain, lung hernia, or lymphocele. All of their questions have been answered.     Valentina Gu. Roxy Manns, MD 11/20/2019 12:24 PM

## 2019-11-22 ENCOUNTER — Inpatient Hospital Stay (HOSPITAL_COMMUNITY): Payer: BC Managed Care – PPO

## 2019-11-22 ENCOUNTER — Ambulatory Visit (HOSPITAL_COMMUNITY)
Admission: RE | Admit: 2019-11-22 | Discharge: 2019-11-22 | Disposition: A | Discharge status: Home / Self Care | Payer: BC Managed Care – PPO | Source: Ambulatory Visit | Attending: Thoracic Surgery (Cardiothoracic Vascular Surgery) | Admitting: Thoracic Surgery (Cardiothoracic Vascular Surgery)

## 2019-11-22 ENCOUNTER — Inpatient Hospital Stay (HOSPITAL_COMMUNITY): Payer: BC Managed Care – PPO | Admitting: Physician Assistant

## 2019-11-22 ENCOUNTER — Encounter (HOSPITAL_COMMUNITY)
Admission: RE | Disposition: A | Discharge status: Home / Self Care | Payer: Self-pay | Source: Ambulatory Visit | Attending: Thoracic Surgery (Cardiothoracic Vascular Surgery)

## 2019-11-22 ENCOUNTER — Other Ambulatory Visit: Payer: Self-pay | Admitting: *Deleted

## 2019-11-22 ENCOUNTER — Other Ambulatory Visit: Payer: Self-pay | Admitting: Thoracic Surgery (Cardiothoracic Vascular Surgery)

## 2019-11-22 ENCOUNTER — Inpatient Hospital Stay (HOSPITAL_COMMUNITY): Payer: BC Managed Care – PPO | Admitting: Anesthesiology

## 2019-11-22 ENCOUNTER — Encounter (HOSPITAL_COMMUNITY): Payer: Self-pay | Admitting: Thoracic Surgery (Cardiothoracic Vascular Surgery)

## 2019-11-22 ENCOUNTER — Other Ambulatory Visit: Payer: Self-pay

## 2019-11-22 DIAGNOSIS — Z87891 Personal history of nicotine dependence: Secondary | ICD-10-CM | POA: Insufficient documentation

## 2019-11-22 DIAGNOSIS — Z8249 Family history of ischemic heart disease and other diseases of the circulatory system: Secondary | ICD-10-CM | POA: Insufficient documentation

## 2019-11-22 DIAGNOSIS — Z79899 Other long term (current) drug therapy: Secondary | ICD-10-CM | POA: Diagnosis not present

## 2019-11-22 DIAGNOSIS — Z538 Procedure and treatment not carried out for other reasons: Secondary | ICD-10-CM | POA: Diagnosis not present

## 2019-11-22 DIAGNOSIS — E785 Hyperlipidemia, unspecified: Secondary | ICD-10-CM | POA: Diagnosis not present

## 2019-11-22 DIAGNOSIS — I341 Nonrheumatic mitral (valve) prolapse: Secondary | ICD-10-CM | POA: Insufficient documentation

## 2019-11-22 DIAGNOSIS — Z8616 Personal history of COVID-19: Secondary | ICD-10-CM | POA: Insufficient documentation

## 2019-11-22 DIAGNOSIS — I340 Nonrheumatic mitral (valve) insufficiency: Secondary | ICD-10-CM

## 2019-11-22 DIAGNOSIS — I428 Other cardiomyopathies: Secondary | ICD-10-CM | POA: Diagnosis not present

## 2019-11-22 DIAGNOSIS — J939 Pneumothorax, unspecified: Secondary | ICD-10-CM

## 2019-11-22 HISTORY — PX: TEE WITHOUT CARDIOVERSION: SHX5443

## 2019-11-22 LAB — ECHO INTRAOPERATIVE TEE

## 2019-11-22 SURGERY — CANCELLED PROCEDURE
Anesthesia: General | Site: Chest | Laterality: Right

## 2019-11-22 MED ORDER — FENTANYL CITRATE (PF) 250 MCG/5ML IJ SOLN
INTRAMUSCULAR | Status: DC | PRN
  Administered 2019-11-22 (×2): 100 ug via INTRAVENOUS

## 2019-11-22 MED ORDER — BUPIVACAINE HCL (PF) 0.5 % IJ SOLN
INTRAMUSCULAR | Status: AC
  Filled 2019-11-22: qty 30

## 2019-11-22 MED ORDER — BUPIVACAINE LIPOSOME 1.3 % IJ SUSP
20.0000 mL | Freq: Once | INTRAMUSCULAR | Status: AC
  Administered 2019-11-22: 20 mL
  Filled 2019-11-22: qty 20

## 2019-11-22 MED ORDER — PLASMA-LYTE 148 IV SOLN
INTRAVENOUS | Status: DC | PRN
  Administered 2019-11-22: 500 mL

## 2019-11-22 MED ORDER — SUGAMMADEX SODIUM 200 MG/2ML IV SOLN
INTRAVENOUS | Status: DC | PRN
  Administered 2019-11-22: 300 mg via INTRAVENOUS

## 2019-11-22 MED ORDER — PHENYLEPHRINE HCL-NACL 10-0.9 MG/250ML-% IV SOLN
INTRAVENOUS | Status: DC | PRN
  Administered 2019-11-22: 40 ug/min via INTRAVENOUS

## 2019-11-22 MED ORDER — METOPROLOL TARTRATE 12.5 MG HALF TABLET
12.5000 mg | ORAL_TABLET | Freq: Once | ORAL | Status: AC
  Administered 2019-11-22: 12.5 mg via ORAL
  Filled 2019-11-22: qty 1

## 2019-11-22 MED ORDER — LACTATED RINGERS IV SOLN: INTRAVENOUS | Status: DC | PRN

## 2019-11-22 MED ORDER — PROPOFOL 10 MG/ML IV BOLUS
INTRAVENOUS | Status: AC
  Filled 2019-11-22: qty 20

## 2019-11-22 MED ORDER — MIDAZOLAM HCL 5 MG/5ML IJ SOLN
INTRAMUSCULAR | Status: DC | PRN
  Administered 2019-11-22: 2 mg via INTRAVENOUS
  Administered 2019-11-22: 3 mg via INTRAVENOUS
  Administered 2019-11-22: 2 mg via INTRAVENOUS

## 2019-11-22 MED ORDER — VANCOMYCIN HCL 1000 MG IV SOLR
INTRAVENOUS | Status: DC | PRN
  Administered 2019-11-22: 1000 mL

## 2019-11-22 MED ORDER — CHLORHEXIDINE GLUCONATE 4 % EX LIQD: 30.0000 mL | CUTANEOUS | Status: DC

## 2019-11-22 MED ORDER — CHLORHEXIDINE GLUCONATE 0.12 % MT SOLN
15.0000 mL | Freq: Once | OROMUCOSAL | Status: AC
  Administered 2019-11-22: 15 mL via OROMUCOSAL
  Filled 2019-11-22: qty 15

## 2019-11-22 MED ORDER — OXYCODONE HCL 5 MG PO TABS: 5.0000 mg | ORAL_TABLET | Freq: Once | ORAL | Status: DC | PRN

## 2019-11-22 MED ORDER — DEXAMETHASONE SODIUM PHOSPHATE 10 MG/ML IJ SOLN
INTRAMUSCULAR | Status: DC | PRN
Start: 2019-11-22 — End: 2019-11-22
  Administered 2019-11-22: 10 mg via INTRAVENOUS

## 2019-11-22 MED ORDER — FENTANYL CITRATE (PF) 250 MCG/5ML IJ SOLN
INTRAMUSCULAR | Status: AC
  Filled 2019-11-22: qty 5

## 2019-11-22 MED ORDER — FENTANYL CITRATE (PF) 100 MCG/2ML IJ SOLN: 25.0000 ug | INTRAMUSCULAR | Status: DC | PRN

## 2019-11-22 MED ORDER — PROPOFOL 10 MG/ML IV BOLUS
INTRAVENOUS | Status: DC | PRN
  Administered 2019-11-22: 160 mg via INTRAVENOUS

## 2019-11-22 MED ORDER — MIDAZOLAM HCL (PF) 10 MG/2ML IJ SOLN
INTRAMUSCULAR | Status: AC
  Filled 2019-11-22: qty 2

## 2019-11-22 MED ORDER — ONDANSETRON HCL 4 MG/2ML IJ SOLN
INTRAMUSCULAR | Status: DC | PRN
Start: 2019-11-22 — End: 2019-11-22
  Administered 2019-11-22: 4 mg via INTRAVENOUS

## 2019-11-22 MED ORDER — OXYCODONE HCL 5 MG/5ML PO SOLN: 5.0000 mg | Freq: Once | ORAL | Status: DC | PRN

## 2019-11-22 MED ORDER — BUPIVACAINE HCL (PF) 0.5 % IJ SOLN
INTRAMUSCULAR | Status: DC | PRN
  Administered 2019-11-22: 30 mL

## 2019-11-22 MED ORDER — ONDANSETRON HCL 4 MG/2ML IJ SOLN: 4.0000 mg | Freq: Once | INTRAMUSCULAR | Status: DC | PRN

## 2019-11-22 MED ORDER — 0.9 % SODIUM CHLORIDE (POUR BTL) OPTIME
TOPICAL | Status: DC | PRN
  Administered 2019-11-22: 4000 mL

## 2019-11-22 MED ORDER — ROCURONIUM BROMIDE 10 MG/ML (PF) SYRINGE
PREFILLED_SYRINGE | INTRAVENOUS | Status: DC | PRN
  Administered 2019-11-22: 50 mg via INTRAVENOUS
  Administered 2019-11-22: 60 mg via INTRAVENOUS
  Administered 2019-11-22: 40 mg via INTRAVENOUS

## 2019-11-22 SURGICAL SUPPLY — 87 items
ADAPTER CARDIO PERF ANTE/RETRO (ADAPTER) ×3 IMPLANT
BAG DECANTER FOR FLEXI CONT (MISCELLANEOUS) ×6 IMPLANT
BLADE CLIPPER SURG (BLADE) ×3 IMPLANT
BLADE STERNUM SYSTEM 6 (BLADE) IMPLANT
BLADE SURG 11 STRL SS (BLADE) ×3 IMPLANT
CANISTER SUCT 3000ML PPV (MISCELLANEOUS) ×6 IMPLANT
CANNULA ADULT BIO-MEDICUS 15FR (CANNULA) IMPLANT
CANNULA FEM VENOUS REMOTE 22FR (CANNULA) ×3 IMPLANT
CANNULA FEMORAL ART 14 SM (MISCELLANEOUS) ×3 IMPLANT
CANNULA GUNDRY RCSP 15FR (MISCELLANEOUS) ×3 IMPLANT
CANNULA OPTISITE PERFUSION 16F (CANNULA) IMPLANT
CANNULA OPTISITE PERFUSION 18F (CANNULA) ×3 IMPLANT
CANNULA OPTISITE PERFUSION 22F (CANNULA) IMPLANT
CANNULA SUMP PERICARDIAL (CANNULA) ×6 IMPLANT
CATH CPB KIT OWEN (MISCELLANEOUS) IMPLANT
CATH KIT ON-Q SILVERSOAK 5IN (CATHETERS) IMPLANT
CELLS DAT CNTRL 66122 CELL SVR (MISCELLANEOUS) ×2 IMPLANT
CNTNR URN SCR LID CUP LEK RST (MISCELLANEOUS) ×2 IMPLANT
CONN ST 1/4X3/8  BEN (MISCELLANEOUS) ×2
CONN ST 1/4X3/8 BEN (MISCELLANEOUS) ×4 IMPLANT
CONNECTOR 1/2X3/8X1/2 3 WAY (MISCELLANEOUS) ×1
CONNECTOR 1/2X3/8X1/2 3WAY (MISCELLANEOUS) ×2 IMPLANT
CONT SPEC 4OZ STRL OR WHT (MISCELLANEOUS) ×1
COVER BACK TABLE 24X17X13 BIG (DRAPES) ×3 IMPLANT
COVER PROBE W GEL 5X96 (DRAPES) ×3 IMPLANT
DERMABOND ADVANCED (GAUZE/BANDAGES/DRESSINGS) ×2
DERMABOND ADVANCED .7 DNX12 (GAUZE/BANDAGES/DRESSINGS) ×4 IMPLANT
DEVICE CLOSURE PERCLS PRGLD 6F (VASCULAR PRODUCTS) ×8 IMPLANT
DEVICE TROCAR PUNCTURE CLOSURE (ENDOMECHANICALS) ×3 IMPLANT
DRAIN CHANNEL 32F RND 10.7 FF (WOUND CARE) ×6 IMPLANT
DRAPE C-ARM 42X72 X-RAY (DRAPES) ×3 IMPLANT
DRAPE CV SPLIT W-CLR ANES SCRN (DRAPES) ×3 IMPLANT
DRAPE INCISE IOBAN 66X45 STRL (DRAPES) ×9 IMPLANT
DRAPE PERI GROIN 82X75IN TIB (DRAPES) ×3 IMPLANT
DRAPE SLUSH/WARMER DISC (DRAPES) ×3 IMPLANT
DRSG COVADERM 4X8 (GAUZE/BANDAGES/DRESSINGS) ×3 IMPLANT
ELECT BLADE 6.5 EXT (BLADE) ×3 IMPLANT
ELECT REM PT RETURN 9FT ADLT (ELECTROSURGICAL) ×6
ELECTRODE REM PT RTRN 9FT ADLT (ELECTROSURGICAL) ×4 IMPLANT
FELT TEFLON 1X6 (MISCELLANEOUS) ×3 IMPLANT
FEMORAL VENOUS CANN RAP (CANNULA) IMPLANT
GAUZE SPONGE 4X4 12PLY STRL LF (GAUZE/BANDAGES/DRESSINGS) ×3 IMPLANT
GLOVE ORTHO TXT STRL SZ7.5 (GLOVE) ×9 IMPLANT
GOWN STRL REUS W/ TWL LRG LVL3 (GOWN DISPOSABLE) ×8 IMPLANT
GOWN STRL REUS W/TWL LRG LVL3 (GOWN DISPOSABLE) ×4
GRASPER SUT TROCAR 14GX15 (MISCELLANEOUS) ×3 IMPLANT
KIT BASIN OR (CUSTOM PROCEDURE TRAY) ×3 IMPLANT
KIT DILATOR VASC 18G NDL (KITS) ×3 IMPLANT
KIT DRAINAGE VACCUM ASSIST (KITS) ×3 IMPLANT
KIT SUCTION CATH 14FR (SUCTIONS) ×3 IMPLANT
KIT TURNOVER KIT B (KITS) ×3 IMPLANT
LEAD PACING MYOCARDI (MISCELLANEOUS) ×3 IMPLANT
LINE VENT (MISCELLANEOUS) ×3 IMPLANT
NEEDLE AORTIC ROOT 14G 7F (CATHETERS) ×3 IMPLANT
NS IRRIG 1000ML POUR BTL (IV SOLUTION) ×12 IMPLANT
PACK E MIN INVASIVE VALVE (SUTURE) ×3 IMPLANT
PACK OPEN HEART (CUSTOM PROCEDURE TRAY) ×3 IMPLANT
PAD ARMBOARD 7.5X6 YLW CONV (MISCELLANEOUS) ×6 IMPLANT
PAD ELECT DEFIB RADIOL ZOLL (MISCELLANEOUS) ×3 IMPLANT
PERCLOSE PROGLIDE 6F (VASCULAR PRODUCTS) ×12
POSITIONER HEAD DONUT 9IN (MISCELLANEOUS) ×3 IMPLANT
RTRCTR WOUND ALEXIS 18CM MED (MISCELLANEOUS) ×3
SET CANNULATION TOURNIQUET (MISCELLANEOUS) ×3 IMPLANT
SET CARDIOPLEGIA MPS 5001102 (MISCELLANEOUS) ×3 IMPLANT
SET IRRIG TUBING LAPAROSCOPIC (IRRIGATION / IRRIGATOR) ×3 IMPLANT
SET MICROPUNCTURE 5F STIFF (MISCELLANEOUS) ×3 IMPLANT
SHEATH PINNACLE 8F 10CM (SHEATH) ×9 IMPLANT
SOL ANTI FOG 6CC (MISCELLANEOUS) ×2 IMPLANT
SOLUTION ANTI FOG 6CC (MISCELLANEOUS) ×1
SUT BONE WAX W31G (SUTURE) ×3 IMPLANT
SUT ETHIBOND X763 2 0 SH 1 (SUTURE) ×3 IMPLANT
SUT GORETEX CV 4 TH 22 36 (SUTURE) IMPLANT
SUT GORETEX CV4 TH-18 (SUTURE) IMPLANT
SUT PROLENE 3 0 SH1 36 (SUTURE) ×12 IMPLANT
SUT SILK 3 0SH CR/8 30 (SUTURE) ×3 IMPLANT
SYR 10ML LL (SYRINGE) ×3 IMPLANT
SYSTEM SAHARA CHEST DRAIN ATS (WOUND CARE) ×3 IMPLANT
TOWEL GREEN STERILE (TOWEL DISPOSABLE) ×3 IMPLANT
TOWEL GREEN STERILE FF (TOWEL DISPOSABLE) ×3 IMPLANT
TRAY FOLEY SLVR 16FR TEMP STAT (SET/KITS/TRAYS/PACK) ×3 IMPLANT
TROCAR XCEL BLADELESS 5X75MML (TROCAR) ×3 IMPLANT
TROCAR XCEL NON-BLD 11X100MML (ENDOMECHANICALS) ×6 IMPLANT
TUBE SUCT INTRACARD DLP 20F (MISCELLANEOUS) ×3 IMPLANT
TUNNELER SHEATH ON-Q 11GX8 DSP (PAIN MANAGEMENT) IMPLANT
UNDERPAD 30X36 HEAVY ABSORB (UNDERPADS AND DIAPERS) ×3 IMPLANT
WATER STERILE IRR 1000ML POUR (IV SOLUTION) ×6 IMPLANT
WIRE EMERALD 3MM-J .035X150CM (WIRE) ×3 IMPLANT

## 2019-11-22 NOTE — Anesthesia Procedure Notes (Addendum)
Procedure Name: Intubation Date/Time: 11/22/2019 8:51 AM Performed by: Leonor Liv, CRNA Pre-anesthesia Checklist: Patient identified, Emergency Drugs available, Suction available and Patient being monitored Patient Re-evaluated:Patient Re-evaluated prior to induction Oxygen Delivery Method: Circle System Utilized Preoxygenation: Pre-oxygenation with 100% oxygen Induction Type: IV induction Ventilation: Two handed mask ventilation required and Oral airway inserted - appropriate to patient size Laryngoscope Size: Mac and 4 Grade View: Grade I Tube type: Oral Endobronchial tube: Left, Double lumen EBT, EBT position confirmed by auscultation and EBT position confirmed by fiberoptic bronchoscope and 37 Fr Number of attempts: 1 Airway Equipment and Method: Oral airway,  Rigid stylet and Fiberoptic brochoscope (Vivasight DLT for positioning) Placement Confirmation: ETT inserted through vocal cords under direct vision,  positive ETCO2 and breath sounds checked- equal and bilateral Secured at: 36 cm Tube secured with: Tape Dental Injury: Teeth and Oropharynx as per pre-operative assessment  Comments: Intubation performed by Zigmund Gottron

## 2019-11-22 NOTE — Anesthesia Procedure Notes (Signed)
Arterial Line Insertion Start/End6/02/2020 7:57 AM Performed by: Roberts Gaudy, MD, Leonor Liv, CRNA, CRNA  Patient location: Pre-op. Preanesthetic checklist: patient identified, IV checked, site marked, risks and benefits discussed, surgical consent, monitors and equipment checked, pre-op evaluation, timeout performed and anesthesia consent Lidocaine 1% used for infiltration radial was placed Catheter size: 20 Fr Hand hygiene performed  and maximum sterile barriers used   Attempts: 1 Procedure performed without using ultrasound guided technique. Following insertion, dressing applied. Post procedure assessment: normal and unchanged  Patient tolerated the procedure well with no immediate complications. Additional procedure comments: SRNA Doretha Sou inserted.

## 2019-11-22 NOTE — Anesthesia Procedure Notes (Addendum)
Central Venous Catheter Insertion Performed by: Roberts Gaudy, MD, anesthesiologist Start/End6/02/2020 7:45 AM, 11/22/2019 7:55 AM Patient location: Pre-op. Preanesthetic checklist: patient identified, IV checked, site marked, risks and benefits discussed, surgical consent, monitors and equipment checked, pre-op evaluation, timeout performed and anesthesia consent Lidocaine 1% used for infiltration and patient sedated Hand hygiene performed  and maximum sterile barriers used  Catheter size: 8.5 Fr Sheath introducer Procedure performed using ultrasound guided technique. Ultrasound Notes:anatomy identified, needle tip was noted to be adjacent to the nerve/plexus identified, no ultrasound evidence of intravascular and/or intraneural injection and image(s) printed for medical record Attempts: 1 Following insertion, line sutured and dressing applied. Post procedure assessment: blood return through all ports, free fluid flow and no air  Patient tolerated the procedure well with no immediate complications.

## 2019-11-22 NOTE — Anesthesia Preprocedure Evaluation (Addendum)
Anesthesia Evaluation  Patient identified by MRN, date of birth, ID band Patient awake    Reviewed: Allergy & Precautions, NPO status , Patient's Chart, lab work & pertinent test results  Airway Mallampati: II  TM Distance: >3 FB Neck ROM: Full    Dental  (+) Teeth Intact, Dental Advisory Given   Pulmonary former smoker,    breath sounds clear to auscultation       Cardiovascular  Rhythm:Regular Rate:Normal - Systolic murmurs    Neuro/Psych    GI/Hepatic   Endo/Other    Renal/GU      Musculoskeletal   Abdominal   Peds  Hematology   Anesthesia Other Findings   Reproductive/Obstetrics                            Anesthesia Physical Anesthesia Plan  ASA: III  Anesthesia Plan: General   Post-op Pain Management:    Induction: Intravenous  PONV Risk Score and Plan: Ondansetron and Dexamethasone  Airway Management Planned: Double Lumen EBT  Additional Equipment:   Intra-op Plan:   Post-operative Plan: Extubation in OR  Informed Consent: I have reviewed the patients History and Physical, chart, labs and discussed the procedure including the risks, benefits and alternatives for the proposed anesthesia with the patient or authorized representative who has indicated his/her understanding and acceptance.     Dental advisory given  Plan Discussed with: CRNA and Anesthesiologist  Anesthesia Plan Comments:         Anesthesia Quick Evaluation

## 2019-11-22 NOTE — Anesthesia Procedure Notes (Deleted)
Central Venous Catheter Insertion Performed by: Roberts Gaudy, MD, anesthesiologist Patient location: Pre-op. Preanesthetic checklist: patient identified, IV checked, site marked, risks and benefits discussed, surgical consent, monitors and equipment checked, pre-op evaluation, timeout performed and anesthesia consent Lidocaine 1% used for infiltration and patient sedated Hand hygiene performed  and maximum sterile barriers used  Catheter size: 8.5 Fr Sheath introducer Procedure performed using ultrasound guided technique. Ultrasound Notes:anatomy identified, needle tip was noted to be adjacent to the nerve/plexus identified, no ultrasound evidence of intravascular and/or intraneural injection and image(s) printed for medical record Attempts: 1 Following insertion, line sutured and dressing applied. Post procedure assessment: blood return through all ports, free fluid flow and no air  Patient tolerated the procedure well with no immediate complications.

## 2019-11-22 NOTE — Progress Notes (Signed)
  Echocardiogram Echocardiogram Transesophageal has been performed.  Darlina Sicilian M 11/22/2019, 9:38 AM

## 2019-11-22 NOTE — Interval H&P Note (Signed)
History and Physical Interval Note:  11/22/2019 6:31 AM  Kevin Medina  has presented today for surgery, with the diagnosis of MR.  The various methods of treatment have been discussed with the patient and family. After consideration of risks, benefits and other options for treatment, the patient has consented to  Procedure(s): MINIMALLY INVASIVE MITRAL VALVE REPAIR (MVR) (Right) TRANSESOPHAGEAL ECHOCARDIOGRAM (TEE) (N/A) as a surgical intervention.  The patient's history has been reviewed, patient examined, no change in status, stable for surgery.  I have reviewed the patient's chart and labs.  Questions were answered to the patient's satisfaction.     Rexene Alberts

## 2019-11-22 NOTE — Op Note (Signed)
CARDIOTHORACIC SURGERY OPERATIVE NOTE  Date of Procedure:   11/22/2019  Preoperative Diagnosis:  Mitral Valve Prolapse with Mitral Regurgitation  Postoperative Diagnosis:  same  Procedure:    Transesophageal Echocardiogram Only - Cardiac Surgery Cancelled  Surgeon:    Valentina Gu. Roxy Manns, MD  Cardiology Consultant:  Eleonore Chiquito, MD  Anesthesiologist:   Roberts Gaudy, MD  Operative Findings:   Bileaflet mitral valve prolapse with mild-moderate late-systolic mitral regurgitation  Normal left ventricular size and systolic function     BRIEF CLINICAL NOTE AND INDICATIONS FOR SURGERY  Patient is a 49 year old male with mitral valve prolapse who has been referred for surgical consultation to discuss treatment options for management of severe symptomatic primary mitral regurgitation.  Patient states that he was first noted to have mitral valve prolapse approximately 15 years ago when he underwent an echocardiogram as part of the work-up for symptoms of palpitations.  He was told at the time that the valve is not leaking much and he did not need surgical intervention but that eventually it might progress.  He has not had regular follow-up with a cardiologist until recently.  Patient developed COVID-19 infection in October 2020.  Symptoms included fever, body aches, and headaches which lasted for approximately a day and a half and loss of taste and smell which recovered within 6 or 7 days.  After his recovery he experienced significant exertional shortness of breath with initial attempt to return to exercising on a regular basis, and he states that it took approximately a month for him to get back to his baseline exercise tolerance.  Over the past 6 months he has noticed more more symptoms of exertional shortness of breath and occasional tachypalpitations.  He exercises religiously 6 days a week including strenuous high intensity training and running on a regular basis.  He has noticed a significant  change in his exercise tolerance and he occasionally gets palpitations that are usually transient and without associated dizziness.  He was seen in follow-up by Maximiano Coss, NP who noticed a prominent systolic murmur on exam and referred the patient to Dr. Quay Burow for formal cardiology consultation.  Transthoracic echocardiogram revealed bileaflet mitral valve prolapse with at least moderate mitral regurgitation and abnormal left ventricular systolic function, ejection fraction estimated 40%.  TEE confirmed the presence of bileaflet prolapse with "moderate to severe" mitral regurgitation.  Left ventricular ejection fraction was estimated 40 to 45%.  There was normal right ventricular size and function.  There was mild left atrial enlargement.  Left and right heart catheterization revealed normal coronary artery anatomy with no significant coronary artery disease and normal right heart pressures.  Cardiothoracic surgical consultation was requested.  Patient was initially seen in consultation on October 09, 2019.  The indications, risk, and potential benefits of elective mitral valve repair were discussed at length and compared and contrasted with continued medical therapy with observation.  All questions have been answered, and the patient provides full informed consent for elective mitral valve repair via right mini-thoracotomy approach.      DETAILS OF THE OPERATIVE PROCEDURE  The patient is brought to the operating room on the above mentioned date and placed in the supine position on the operating table.  A radial arterial line and left central venous catheter are placed for arterial and venous access and monitoring purposes.  General endotracheal anesthesia is induced uneventfully under the care and direction of Dr. Roberts Gaudy.  Baseline transesophageal echocardiogram was performed by Dr. Linna Caprice.  With the patient  asleep but normotensive a thorough examination using TEE reveals bileaflet  prolapse with multiple elongated chordae tendinae but no flail segments and no ruptured chordae tendinae.  Color flow imaging reveals only mild to moderate late systolic mitral regurgitation.  In many views there appears to be very mild mitral regurgitation.  Deep transgastric views reveals a more prominent eccentric jet of regurgitation that occurs only in late systole.  The left atrium is not dilated.  The left ventricle is not dilated.  Left ventricular systolic function appears normal with calculated ejection fraction 58%.  Pulmonary venous flow appears normal in all 4 pulmonary veins.  Intraoperative consultation with Dr. Grayling Congress from cardiology is obtained and all TEE images reviewed.  After extensive discussion it is the collective opinion of all parties that proceeding with elective mitral valve repair should be postponed.  Exercise stress echocardiography and/or formal cardiopulmonary exercise testing could be considered versus continued medical therapy with observation only.  Patient is extubated in the operating room and transported to the postanesthesia care unit in stable condition.   Valentina Gu. Roxy Manns MD 11/22/2019 10:29 AM

## 2019-11-22 NOTE — Transfer of Care (Signed)
Immediate Anesthesia Transfer of Care Note  Patient: Kevin Medina  Procedure(s) Performed: TRANSESOPHAGEAL ECHOCARDIOGRAM (TEE) (N/A ) Cancelled Procedure (N/A )  Patient Location: PACU  Anesthesia Type:General  Level of Consciousness: awake, alert  and oriented  Airway & Oxygen Therapy: Patient Spontanous Breathing and Patient connected to face mask oxygen  Post-op Assessment: Report given to RN, Post -op Vital signs reviewed and stable and Patient moving all extremities  Post vital signs: Reviewed and stable  Last Vitals:  Vitals Value Taken Time  BP 134/95 11/22/19 1045  Temp    Pulse 66 11/22/19 1104  Resp 16 11/22/19 1104  SpO2 99 % 11/22/19 1104  Vitals shown include unvalidated device data.  Last Pain:  Vitals:   11/22/19 1045  TempSrc:   PainSc: 0-No pain      Patients Stated Pain Goal: 3 (54/56/25 6389)  Complications: No apparent anesthesia complications

## 2019-11-22 NOTE — Discharge Summary (Signed)
T[facility] DISCHARGE NOTE   Discussion at bedside in postanesthesia care unit with patient and his wife.  Discussed findings of transesophageal echocardiogram and our consensus opinion that at least under current circumstances the patient's mitral regurgitation is clearly not severe and probably does not warrant surgical intervention at this time.  Because of the patient's reported symptoms of some change in his aerobic exercise tolerance we will plan to proceed with elective outpatient exercise stress echocardiography and possible formal cardiopulmonary exercise testing.  Elective mitral valve repair could be reconsidered if the patient clearly demonstrates severe mitral regurgitation and elevated pulmonary artery pressures during exercise.  In the absence of such findings we feel that it would be most appropriate to continue to follow the patient on medical therapy alone.  All questions answered.  Rexene Alberts, MD 11/22/2019 11:53 AM

## 2019-11-23 ENCOUNTER — Other Ambulatory Visit (HOSPITAL_COMMUNITY): Payer: Self-pay | Admitting: *Deleted

## 2019-11-23 ENCOUNTER — Encounter (HOSPITAL_COMMUNITY): Payer: Self-pay | Admitting: Thoracic Surgery (Cardiothoracic Vascular Surgery)

## 2019-11-23 ENCOUNTER — Ambulatory Visit (HOSPITAL_COMMUNITY): Payer: BC Managed Care – PPO | Attending: Thoracic Surgery (Cardiothoracic Vascular Surgery)

## 2019-11-23 DIAGNOSIS — R0609 Other forms of dyspnea: Secondary | ICD-10-CM | POA: Diagnosis not present

## 2019-11-23 DIAGNOSIS — I340 Nonrheumatic mitral (valve) insufficiency: Secondary | ICD-10-CM

## 2019-11-23 LAB — POCT I-STAT 7, (LYTES, BLD GAS, ICA,H+H)
Acid-Base Excess: 3 mmol/L — ABNORMAL HIGH (ref 0.0–2.0)
Bicarbonate: 29.5 mmol/L — ABNORMAL HIGH (ref 20.0–28.0)
Calcium, Ion: 1.24 mmol/L (ref 1.15–1.40)
HCT: 40 % (ref 39.0–52.0)
Hemoglobin: 13.6 g/dL (ref 13.0–17.0)
O2 Saturation: 100 %
Potassium: 4 mmol/L (ref 3.5–5.1)
Sodium: 142 mmol/L (ref 135–145)
TCO2: 31 mmol/L (ref 22–32)
pCO2 arterial: 54.4 mmHg — ABNORMAL HIGH (ref 32.0–48.0)
pH, Arterial: 7.342 — ABNORMAL LOW (ref 7.350–7.450)
pO2, Arterial: 501 mmHg — ABNORMAL HIGH (ref 83.0–108.0)

## 2019-11-23 LAB — POCT I-STAT, CHEM 8
BUN: 12 mg/dL (ref 6–20)
Calcium, Ion: 1.23 mmol/L (ref 1.15–1.40)
Chloride: 103 mmol/L (ref 98–111)
Creatinine, Ser: 0.8 mg/dL (ref 0.61–1.24)
Glucose, Bld: 104 mg/dL — ABNORMAL HIGH (ref 70–99)
HCT: 38 % — ABNORMAL LOW (ref 39.0–52.0)
Hemoglobin: 12.9 g/dL — ABNORMAL LOW (ref 13.0–17.0)
Potassium: 3.9 mmol/L (ref 3.5–5.1)
Sodium: 142 mmol/L (ref 135–145)
TCO2: 29 mmol/L (ref 22–32)

## 2019-11-23 MED FILL — Heparin Sodium (Porcine) Inj 1000 Unit/ML: INTRAMUSCULAR | Qty: 10 | Status: AC

## 2019-11-23 MED FILL — Sodium Chloride IV Soln 0.9%: INTRAVENOUS | Qty: 2000 | Status: AC

## 2019-11-23 MED FILL — Electrolyte-R (PH 7.4) Solution: INTRAVENOUS | Qty: 3000 | Status: AC

## 2019-11-23 MED FILL — Mannitol IV Soln 20%: INTRAVENOUS | Qty: 500 | Status: AC

## 2019-11-23 MED FILL — Lidocaine HCl Local Soln Prefilled Syringe 100 MG/5ML (2%): INTRAMUSCULAR | Qty: 5 | Status: AC

## 2019-11-24 NOTE — Anesthesia Postprocedure Evaluation (Signed)
Anesthesia Post Note  Patient: Kevin Medina  Procedure(s) Performed: TRANSESOPHAGEAL ECHOCARDIOGRAM (TEE) (N/A ) Cancelled Procedure (N/A )     Patient location during evaluation: PACU Anesthesia Type: General Level of consciousness: awake and alert Pain management: pain level controlled Vital Signs Assessment: post-procedure vital signs reviewed and stable Respiratory status: spontaneous breathing, nonlabored ventilation, respiratory function stable and patient connected to nasal cannula oxygen Cardiovascular status: blood pressure returned to baseline and stable Postop Assessment: no apparent nausea or vomiting Anesthetic complications: no   No complications documented.  Last Vitals:  Vitals:   11/22/19 1150 11/22/19 1205  BP: 129/81 127/89  Pulse: 62 63  Resp: 16 14  Temp:  36.4 C  SpO2: 98% 98%    Last Pain:  Vitals:   11/22/19 1205  TempSrc:   PainSc: 0-No pain                 [place] Basham COKER

## 2019-11-27 ENCOUNTER — Encounter: Payer: Self-pay | Admitting: Thoracic Surgery (Cardiothoracic Vascular Surgery)

## 2019-11-29 ENCOUNTER — Encounter: Payer: Self-pay | Admitting: Thoracic Surgery (Cardiothoracic Vascular Surgery)

## 2019-12-13 ENCOUNTER — Telehealth (HOSPITAL_COMMUNITY): Payer: Self-pay

## 2019-12-13 NOTE — Telephone Encounter (Signed)
Attempted to contact the pat for his stress test. Detailed instructions left on the patient's answering machine per DPR. Asked to call back with any questions

## 2019-12-14 ENCOUNTER — Ambulatory Visit (HOSPITAL_COMMUNITY): Payer: BC Managed Care – PPO | Attending: Cardiology

## 2019-12-14 ENCOUNTER — Ambulatory Visit (HOSPITAL_BASED_OUTPATIENT_CLINIC_OR_DEPARTMENT_OTHER): Payer: BC Managed Care – PPO

## 2019-12-14 ENCOUNTER — Other Ambulatory Visit: Payer: Self-pay

## 2019-12-14 DIAGNOSIS — I340 Nonrheumatic mitral (valve) insufficiency: Secondary | ICD-10-CM

## 2019-12-15 ENCOUNTER — Other Ambulatory Visit: Payer: Self-pay

## 2019-12-25 ENCOUNTER — Other Ambulatory Visit: Payer: Self-pay

## 2019-12-25 DIAGNOSIS — I341 Nonrheumatic mitral (valve) prolapse: Secondary | ICD-10-CM

## 2019-12-25 DIAGNOSIS — I340 Nonrheumatic mitral (valve) insufficiency: Secondary | ICD-10-CM

## 2019-12-25 NOTE — Progress Notes (Signed)
echo

## 2019-12-30 ENCOUNTER — Telehealth: Payer: Self-pay | Admitting: Internal Medicine

## 2019-12-30 DIAGNOSIS — R0609 Other forms of dyspnea: Secondary | ICD-10-CM

## 2019-12-30 NOTE — Telephone Encounter (Signed)
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 He has had covid in the distant past. He has mitral valve leak. He has dyspnea on exertion now and during CPST desaturated  Plan  - check d-dimer blood work - to be done week of 01/01/20 - order HRCT supine and prone - done preferrabley sometime week of 01/01/20 - if d-dimer abnormal will add CTA or VQ  - give him first avail appt to see me Aug/Sept for now is fine - but if needed I can see him sooner by add-on after d/w Lauren  Thanks    SIGNATURE    Dr. Brand Males, M.D., F.C.C.P,  Pulmonary and Critical Care Medicine Staff Physician, [place] Director - Interstitial Lung Disease  Program  P[facility] [place] [facility] [place], [place], [address]  Pager: [phone], If no answer  OR between  19:00-7:00h: page [phone]  [phone] Telephone (clinical office): [phone] Telephone (research): [phone]  9:36 AM 12/30/2019        Current Outpatient Medications:  .  albuterol (VENTOLIN HFA) 108 (90 Base) MCG/ACT inhaler, Inhale 2 puffs into the lungs every 6 (six) hours as needed for wheezing or shortness of breath., Disp: 18 g, Rfl: 1 .  DIGESTIVE ENZYMES PO, Take 1 tablet by mouth daily. , Disp: , Rfl:  .  Multiple Vitamin (MULTIVITAMIN) tablet, Take 1 tablet by mouth daily., Disp: , Rfl:  .  OMEGA-3 FATTY ACIDS PO, Take 1,000 mg by mouth daily. , Disp: , Rfl:  .  psyllium (METAMUCIL) 58.6 % packet, Take 1 packet by mouth daily., Disp: , Rfl:  .  Turmeric 500 MG CAPS, Take 1,000 mg by mouth in the morning and at bedtime. , Disp: , Rfl:   Current Facility-Administered Medications:  .  sodium chloride flush (NS) 0.9 % injection 3 mL, 3 mL, Intravenous, Q12H, Gwenlyn Found Pearletha Forge, MD

## 2020-01-01 ENCOUNTER — Other Ambulatory Visit: Payer: BC Managed Care – PPO

## 2020-01-01 ENCOUNTER — Telehealth: Payer: Self-pay | Admitting: Internal Medicine

## 2020-01-01 DIAGNOSIS — R0609 Other forms of dyspnea: Secondary | ICD-10-CM

## 2020-01-01 DIAGNOSIS — R0600 Dyspnea, unspecified: Secondary | ICD-10-CM | POA: Diagnosis not present

## 2020-01-01 LAB — D-DIMER, QUANTITATIVE: D-Dimer, Quant: <0.19 mcg/mL FEU (ref <0.50)

## 2020-01-01 NOTE — Progress Notes (Signed)
D-dimer normal. See phone note tog et HRCT supine and prine

## 2020-01-01 NOTE — Telephone Encounter (Signed)
Attempted to call pt but unable to reach. Left message for him to return call. °

## 2020-01-01 NOTE — Telephone Encounter (Signed)
Results for SUMEDH, SHINSATO" (MRN [id number]) as of 01/01/2020 17:30  Ref. Range 01/01/2020 14:10  D-Dimer, Quant Latest Ref Range: <0.50 mcg/mL FEU <0.19     D-dimer is normal  Plan  - get hrct supine and prone with inspiratory and expiratory phase - indication - dyspnea, history of covid - please get CT sometime this week or next

## 2020-01-01 NOTE — Telephone Encounter (Signed)
Pt called back about this. Please return call before 11:15 or leave detailed message.

## 2020-01-01 NOTE — Telephone Encounter (Signed)
Order for the HRCT has been placed.  Attempted to call pt but line went straight to VM. Left pt a detailed message letting him know the results of the labwork and that we will have him get CT which was recently discussed with him. Nothing further needed.

## 2020-01-01 NOTE — Telephone Encounter (Signed)
Called and spoke with pt letting him know the info stated by MR and he verbalized understanding. Pt has been scheduled for an appt with MR 8/17. labwork and CT orders have been placed. Nothing further needed.

## 2020-01-02 ENCOUNTER — Telehealth: Payer: Self-pay | Admitting: Cardiovascular Disease

## 2020-01-02 NOTE — Telephone Encounter (Signed)
Patient states he is requesting to speak with Malachy Mood to make her aware that he will be switching providers.

## 2020-01-02 NOTE — Telephone Encounter (Signed)
Returned call to patient no answer.LMTC. 

## 2020-01-02 NOTE — Telephone Encounter (Signed)
Will route to [place]-

## 2020-01-05 ENCOUNTER — Ambulatory Visit: Payer: BC Managed Care – PPO | Admitting: Cardiovascular Disease

## 2020-01-05 ENCOUNTER — Telehealth: Payer: Self-pay | Admitting: Cardiovascular Disease

## 2020-01-05 ENCOUNTER — Telehealth (HOSPITAL_COMMUNITY): Payer: Self-pay | Admitting: Vascular Surgery

## 2020-01-05 NOTE — Telephone Encounter (Signed)
Transferred call to cheryl

## 2020-01-05 NOTE — Telephone Encounter (Signed)
Encounter open in error 

## 2020-01-05 NOTE — Telephone Encounter (Signed)
Received a call back from patient.He stated Dr.Berry never responded to his email sent 2 weeks ago.Advised Dr.Berry was out of office last week.Stated he continues to have symptoms.Stated he his changing care to [facility].Stated he has appointment with him 01/09/20.

## 2020-01-05 NOTE — Telephone Encounter (Signed)
Returned call to patient no answer.LMTC. 

## 2020-01-09 ENCOUNTER — Other Ambulatory Visit: Payer: Self-pay

## 2020-01-09 ENCOUNTER — Ambulatory Visit (HOSPITAL_COMMUNITY)
Admission: RE | Admit: 2020-01-09 | Discharge: 2020-01-09 | Disposition: A | Discharge status: Home / Self Care | Payer: BC Managed Care – PPO | Source: Ambulatory Visit | Attending: Internal Medicine | Admitting: Internal Medicine

## 2020-01-09 ENCOUNTER — Encounter (HOSPITAL_COMMUNITY): Payer: Self-pay | Admitting: Internal Medicine

## 2020-01-09 VITALS — BP 125/80 | HR 68 | Wt 171.8 lb

## 2020-01-09 DIAGNOSIS — R0682 Tachypnea, not elsewhere classified: Secondary | ICD-10-CM | POA: Insufficient documentation

## 2020-01-09 DIAGNOSIS — Z87891 Personal history of nicotine dependence: Secondary | ICD-10-CM | POA: Diagnosis not present

## 2020-01-09 DIAGNOSIS — R002 Palpitations: Secondary | ICD-10-CM | POA: Insufficient documentation

## 2020-01-09 DIAGNOSIS — Z8249 Family history of ischemic heart disease and other diseases of the circulatory system: Secondary | ICD-10-CM | POA: Diagnosis not present

## 2020-01-09 DIAGNOSIS — Z8349 Family history of other endocrine, nutritional and metabolic diseases: Secondary | ICD-10-CM | POA: Insufficient documentation

## 2020-01-09 DIAGNOSIS — I340 Nonrheumatic mitral (valve) insufficiency: Secondary | ICD-10-CM | POA: Insufficient documentation

## 2020-01-09 DIAGNOSIS — E785 Hyperlipidemia, unspecified: Secondary | ICD-10-CM | POA: Insufficient documentation

## 2020-01-09 DIAGNOSIS — I341 Nonrheumatic mitral (valve) prolapse: Secondary | ICD-10-CM | POA: Insufficient documentation

## 2020-01-09 DIAGNOSIS — Z8616 Personal history of COVID-19: Secondary | ICD-10-CM | POA: Insufficient documentation

## 2020-01-09 NOTE — H&P (View-Only) (Signed)
ADVANCED HF CLINIC CONSULT NOTE   Primary Cardiologist: Gwenlyn Found  HPI:  Kevin Medina is a 49 year old male with HL, mitral valve prolapse and mitral regurgitation referred by Teresa Coombs DO fo further evaluation of mitral regurgitation.    He was first noted to have mitral valve prolapse approximately 15 years ago when he underwent an echocardiogram as part of the work-up for symptoms of palpitations.  He was told at the time that the valve is not leaking much and he did not need surgical intervention but that eventually it might progress. Developed COVID-19 infection in October 2020.  After his recovery he experienced significant exertional shortness of breath with initial attempt to return to exercising on a regular basis, and he states that it took approximately a month for him to get back to his baseline exercise tolerance.  Over the past 6 months he has noticed more more symptoms of exertional shortness of breath and occasional tachypalpitations.  He exercises religiously 6 days a week including strenuous high intensity training and running.  He has noticed a significant change in his exercise tolerance and he occasionally gets palpitations that are usually transient and without associated dizziness.  He was seen in follow-up by Maximiano Coss, NP who noticed a prominent systolic murmur on exam and referred the patient to Dr. Quay Burow for formal cardiology consultation.  Transthoracic echocardiogram revealed bileaflet mitral valve prolapse with at least moderate mitral regurgitation and abnormal left ventricular systolic function, ejection fraction estimated 40%.  TEE confirmed the presence of bileaflet prolapse with "moderate to severe" mitral regurgitation.  Left ventricular ejection fraction was estimated 40 to 45%.  There was normal right ventricular size and function.  There was mild left atrial enlargement.  Left and right heart catheterization revealed normal coronary artery anatomy with no  significant coronary artery disease and normal right heart pressures.    He was seen by Dr. Roxy Manns and scheduled for MVR on 6//9/21. However initial intra-op TEE showed minimal MR so surgery was cancelled. Subsequently had CPX test and stress echo. CPX showed excellent functional capacity with mildly elevated VEVCO2 suggestive of increased pulmonary pressures with exercise. Stress echo with mod MR at rest and exercise   Crossfit 3x/week, weights 1 day. Run 2 days per week for 5k. Says pace is about 30-60 seconds per mile slower than before. Says at time even has problems walking up steps at times. Often has heart pounding prominently and feels it is irregular sometimes. No edema, orthopnea or PND. Has had occasional very heavy workouts where he can taste blood but no frank hemoptysis.     RHC  RA = 2 RV = 19/3 PA = 10/8 (6) PCW = 5 (v = 6) Fick cardiac output/index =5.9/3.   CPX 11/23/19  FVC 4.64 (111%)    FEV1 3.52 (105%)     FEV1/FVC 76 (94%)     MVV 185 (136%)     Resting HR: 68 Standing HR: 71 Peak HR: 169  (99% age predicted max HR)  BP rest: 136/78 Standing BP: 132/72 BP peak: 198/66  Peak VO2: 49.8 (152% predicted peak VO2)  VE/VCO2 slope: 35  OUES: 3.29  Peak RER: 1.05  Ventilatory threshold: 39.1 (120% predicted or 79% measured peak VO2)  VE/MVV: 77%  O2pulse: 23  (153% predicted O2pulse)   Stress echo 12/14/19    1. Moderate to severe mitral regurgitation at rest and on exertion.    2. Normal RVSP at rest and on peak exertion.  3. The baseline PASP was estimated to be 13.6 mmHg. The PASP increased to  20.8 mmHg at peak stress.     Review of Systems: [y] = yes, [ ]  = no   General: Weight gain [ ] ; Weight loss [ ] ; Anorexia [ ] ; Fatigue [ ] ; Fever [ ] ; Chills [ ] ; Weakness [ ]   Cardiac: Chest pain/pressure [ ] ; Resting SOB [ ] ; Exertional SOB Blue.Reese ]; Orthopnea [ ] ; Pedal Edema [ ] ; Palpitations Blue.Reese ]; Syncope [ ] ; Presyncope [ ] ; Paroxysmal nocturnal  dyspnea[ ]   Pulmonary: Cough [ ] ; Wheezing[ ] ; Hemoptysis[ ] ; Sputum [ ] ; Snoring [ ]   GI: Vomiting[ ] ; Dysphagia[ ] ; Melena[ ] ; Hematochezia [ ] ; Heartburn[ ] ; Abdominal pain [ ] ; Constipation [ ] ; Diarrhea [ ] ; BRBPR [ ]   GU: Hematuria[ ] ; Dysuria [ ] ; Nocturia[ ]   Vascular: Pain in legs with walking [ ] ; Pain in feet with lying flat [ ] ; Non-healing sores [ ] ; Stroke [ ] ; TIA [ ] ; Slurred speech [ ] ;  Neuro: Headaches[ ] ; Vertigo[ ] ; Seizures[ ] ; Paresthesias[ ] ;Blurred vision [ ] ; Diplopia [ ] ; Vision changes [ ]   Ortho/Skin: Arthritis [ ] ; Joint pain [ ] ; Muscle pain [ ] ; Joint swelling [ ] ; Back Pain [ ] ; Rash [ ]   Psych: Depression[ ] ; Anxiety[ ]   Heme: Bleeding problems [ ] ; Clotting disorders [ ] ; Anemia [ ]   Endocrine: Diabetes [ ] ; Thyroid dysfunction[ ]    Past Medical History:  Diagnosis Date  . Allergy   . Arrhythmia   . Exercise-induced asthma   . Heart murmur    due to MVP   . Hyperlipidemia   . Mitral regurgitation   . Mitral valve prolapse     Current Outpatient Medications  Medication Sig Dispense Refill  . DIGESTIVE ENZYMES PO Take 1 tablet by mouth daily.     . Multiple Vitamin (MULTIVITAMIN) tablet Take 1 tablet by mouth daily.    . OMEGA-3 FATTY ACIDS PO Take 1,000 mg by mouth daily.     . psyllium (METAMUCIL) 58.6 % packet Take 1 packet by mouth daily.    . Turmeric 500 MG CAPS Take 1,000 mg by mouth in the morning and at bedtime.      Current Facility-Administered Medications  Medication Dose Route Frequency Provider Last Rate Last Admin  . sodium chloride flush (NS) 0.9 % injection 3 mL  3 mL Intravenous Q12H Lorretta Harp, MD        No Known Allergies    Social History   Socioeconomic History  . Marital status: Married    Spouse name: Not on file  . Number of children: 1  . Years of education: Not on file  . Highest education level: Not on file  Occupational History  . Not on file  Tobacco Use  . Smoking status: Former Smoker    Quit  date: 06/15/1997    Years since quitting: 22.5  . Smokeless tobacco: Former Network engineer  . Vaping Use: Never used  Substance and Sexual Activity  . Alcohol use: Yes    Alcohol/week: 10.0 standard drinks    Types: 10 Standard drinks or equivalent per week    Comment: social- none since jan 08-2019  . Drug use: Never  . Sexual activity: Yes  Other Topics Concern  . Not on file  Social History Narrative  . Not on file   Social Determinants of Health   Financial Resource Strain:   . Difficulty of Paying  Living Expenses:   Food Insecurity:   . Worried About Charity fundraiser in the Last Year:   . Arboriculturist in the Last Year:   Transportation Needs:   . Film/video editor (Medical):   Marland Kitchen Lack of Transportation (Non-Medical):   Physical Activity:   . Days of Exercise per Week:   . Minutes of Exercise per Session:   Stress:   . Feeling of Stress :   Social Connections:   . Frequency of Communication with Friends and Family:   . Frequency of Social Gatherings with Friends and Family:   . Attends Religious Services:   . Active Member of Clubs or Organizations:   . Attends Archivist Meetings:   Marland Kitchen Marital Status:   Intimate Partner Violence:   . Fear of Current or Ex-Partner:   . Emotionally Abused:   Marland Kitchen Physically Abused:   . Sexually Abused:       Family History  Problem Relation Age of Onset  . Cancer Mother   . Healthy Mother   . Breast cancer Mother   . Hyperlipidemia Father   . Hypertension Father   . Healthy Sister   . Cancer Maternal Grandmother   . Stroke Maternal Grandfather   . Hyperlipidemia Paternal Grandfather   . Colon polyps Maternal Uncle   . Colon cancer Neg Hx   . Esophageal cancer Neg Hx   . Rectal cancer Neg Hx   . Stomach cancer Neg Hx     Vitals:   01/09/20 1450  BP: 125/80  Pulse: 68  SpO2: 98%  Weight: 77.9 kg (171 lb 12.8 oz)    PHYSICAL EXAM: General:  Fit appearing. No respiratory difficulty HEENT:  normal Neck: supple. no JVD. Carotids 2+ bilat; no bruits. No lymphadenopathy or thryomegaly appreciated. Cor: PMI nondisplaced. Sound bigeminal Late systolic 2/6 MR  Lungs: clear Abdomen: soft, nontender, nondistended. No hepatosplenomegaly. No bruits or masses. Good bowel sounds. Extremities: no cyanosis, clubbing, rash, edema Neuro: alert & oriented x 3, cranial nerves grossly intact. moves all 4 extremities w/o difficulty. Affect pleasant.  ECG: NSR with occasional PACs   ASSESSMENT & PLAN:  1) Mitral regurgitation  - Resting echo, TEE, stress echo and cath studies viewed personally - Due to Barlow's disease - Resting echo with EF 40-45% with moderate to severe MR but when sedated/unloaded MR seems much less severe on cath and intra-op TEE - CPX excellent functional capacity with mildly elevated VE/VCO2 slope suggested of increased pulmonary pressures with exercise - Stress echo with moderate MR at baseline. Mildly worse with exercise - I discussed the case with Dr. Roxy Manns today and we both agree that it is not a matter of 'if" but "when" he will need to have his valve fixed. That said, given his high functional capacity currently, even with moderate to severe MR it is unlikely that repair will improve his exercise tolerance and may even worsen it so close watchful waiting may be best but we will need to follow closely to avoid deterioration of LV function - Will plan exercise RHC to further assess pulmonary pressures (and v-waves) with exercise to potentially assist with timing - Consider addition of losartan  2) Palpitations - with his degree of MR and LAE he is at high risk for AF and other atrial dysrhythmias - will place 14-day ziopatch  Total time spent 45 minutes. Over half that time spent discussing above.  Glori Bickers, MD  1:20 AM

## 2020-01-09 NOTE — Telephone Encounter (Signed)
Lorretta Harp, MD  You 16 hours ago (5:09 PM)   Might be worth sending him to [facility] for another opinion.    Patient has OV with Dr. Haroldine Laws today

## 2020-01-09 NOTE — Progress Notes (Signed)
Zio patch placed onto patient.  All instructions and information reviewed with patient, they verbalize understanding with no questions. 

## 2020-01-09 NOTE — Progress Notes (Signed)
ADVANCED HF CLINIC CONSULT NOTE   Primary Cardiologist: Gwenlyn Found  HPI:  Kevin Medina is a 49 year old male with HL, mitral valve prolapse and mitral regurgitation referred by Teresa Coombs DO fo further evaluation of mitral regurgitation.    He was first noted to have mitral valve prolapse approximately 15 years ago when he underwent an echocardiogram as part of the work-up for symptoms of palpitations.  He was told at the time that the valve is not leaking much and he did not need surgical intervention but that eventually it might progress. Developed COVID-19 infection in October 2020.  After his recovery he experienced significant exertional shortness of breath with initial attempt to return to exercising on a regular basis, and he states that it took approximately a month for him to get back to his baseline exercise tolerance.  Over the past 6 months he has noticed more more symptoms of exertional shortness of breath and occasional tachypalpitations.  He exercises religiously 6 days a week including strenuous high intensity training and running.  He has noticed a significant change in his exercise tolerance and he occasionally gets palpitations that are usually transient and without associated dizziness.  He was seen in follow-up by Maximiano Coss, NP who noticed a prominent systolic murmur on exam and referred the patient to Dr. Quay Burow for formal cardiology consultation.  Transthoracic echocardiogram revealed bileaflet mitral valve prolapse with at least moderate mitral regurgitation and abnormal left ventricular systolic function, ejection fraction estimated 40%.  TEE confirmed the presence of bileaflet prolapse with "moderate to severe" mitral regurgitation.  Left ventricular ejection fraction was estimated 40 to 45%.  There was normal right ventricular size and function.  There was mild left atrial enlargement.  Left and right heart catheterization revealed normal coronary artery anatomy with no  significant coronary artery disease and normal right heart pressures.    He was seen by Dr. Roxy Manns and scheduled for MVR on 6//9/21. However initial intra-op TEE showed minimal MR so surgery was cancelled. Subsequently had CPX test and stress echo. CPX showed excellent functional capacity with mildly elevated VEVCO2 suggestive of increased pulmonary pressures with exercise. Stress echo with mod MR at rest and exercise   Crossfit 3x/week, weights 1 day. Run 2 days per week for 5k. Says pace is about 30-60 seconds per mile slower than before. Says at time even has problems walking up steps at times. Often has heart pounding prominently and feels it is irregular sometimes. No edema, orthopnea or PND. Has had occasional very heavy workouts where he can taste blood but no frank hemoptysis.     RHC  RA = 2 RV = 19/3 PA = 10/8 (6) PCW = 5 (v = 6) Fick cardiac output/index =5.9/3.   CPX 11/23/19  FVC 4.64 (111%)    FEV1 3.52 (105%)     FEV1/FVC 76 (94%)     MVV 185 (136%)     Resting HR: 68 Standing HR: 71 Peak HR: 169  (99% age predicted max HR)  BP rest: 136/78 Standing BP: 132/72 BP peak: 198/66  Peak VO2: 49.8 (152% predicted peak VO2)  VE/VCO2 slope: 35  OUES: 3.29  Peak RER: 1.05  Ventilatory threshold: 39.1 (120% predicted or 79% measured peak VO2)  VE/MVV: 77%  O2pulse: 23  (153% predicted O2pulse)   Stress echo 12/14/19    1. Moderate to severe mitral regurgitation at rest and on exertion.    2. Normal RVSP at rest and on peak exertion.  3. The baseline PASP was estimated to be 13.6 mmHg. The PASP increased to  20.8 mmHg at peak stress.     Review of Systems: [y] = yes, [ ]  = no   General: Weight gain [ ] ; Weight loss [ ] ; Anorexia [ ] ; Fatigue [ ] ; Fever [ ] ; Chills [ ] ; Weakness [ ]   Cardiac: Chest pain/pressure [ ] ; Resting SOB [ ] ; Exertional SOB Blue.Reese ]; Orthopnea [ ] ; Pedal Edema [ ] ; Palpitations Blue.Reese ]; Syncope [ ] ; Presyncope [ ] ; Paroxysmal nocturnal  dyspnea[ ]   Pulmonary: Cough [ ] ; Wheezing[ ] ; Hemoptysis[ ] ; Sputum [ ] ; Snoring [ ]   GI: Vomiting[ ] ; Dysphagia[ ] ; Melena[ ] ; Hematochezia [ ] ; Heartburn[ ] ; Abdominal pain [ ] ; Constipation [ ] ; Diarrhea [ ] ; BRBPR [ ]   GU: Hematuria[ ] ; Dysuria [ ] ; Nocturia[ ]   Vascular: Pain in legs with walking [ ] ; Pain in feet with lying flat [ ] ; Non-healing sores [ ] ; Stroke [ ] ; TIA [ ] ; Slurred speech [ ] ;  Neuro: Headaches[ ] ; Vertigo[ ] ; Seizures[ ] ; Paresthesias[ ] ;Blurred vision [ ] ; Diplopia [ ] ; Vision changes [ ]   Ortho/Skin: Arthritis [ ] ; Joint pain [ ] ; Muscle pain [ ] ; Joint swelling [ ] ; Back Pain [ ] ; Rash [ ]   Psych: Depression[ ] ; Anxiety[ ]   Heme: Bleeding problems [ ] ; Clotting disorders [ ] ; Anemia [ ]   Endocrine: Diabetes [ ] ; Thyroid dysfunction[ ]    Past Medical History:  Diagnosis Date  . Allergy   . Arrhythmia   . Exercise-induced asthma   . Heart murmur    due to MVP   . Hyperlipidemia   . Mitral regurgitation   . Mitral valve prolapse     Current Outpatient Medications  Medication Sig Dispense Refill  . DIGESTIVE ENZYMES PO Take 1 tablet by mouth daily.     . Multiple Vitamin (MULTIVITAMIN) tablet Take 1 tablet by mouth daily.    . OMEGA-3 FATTY ACIDS PO Take 1,000 mg by mouth daily.     . psyllium (METAMUCIL) 58.6 % packet Take 1 packet by mouth daily.    . Turmeric 500 MG CAPS Take 1,000 mg by mouth in the morning and at bedtime.      Current Facility-Administered Medications  Medication Dose Route Frequency Provider Last Rate Last Admin  . sodium chloride flush (NS) 0.9 % injection 3 mL  3 mL Intravenous Q12H Lorretta Harp, MD        No Known Allergies    Social History   Socioeconomic History  . Marital status: Married    Spouse name: Not on file  . Number of children: 1  . Years of education: Not on file  . Highest education level: Not on file  Occupational History  . Not on file  Tobacco Use  . Smoking status: Former Smoker    Quit  date: 06/15/1997    Years since quitting: 22.5  . Smokeless tobacco: Former Network engineer  . Vaping Use: Never used  Substance and Sexual Activity  . Alcohol use: Yes    Alcohol/week: 10.0 standard drinks    Types: 10 Standard drinks or equivalent per week    Comment: social- none since jan 08-2019  . Drug use: Never  . Sexual activity: Yes  Other Topics Concern  . Not on file  Social History Narrative  . Not on file   Social Determinants of Health   Financial Resource Strain:   . Difficulty of Paying  Living Expenses:   Food Insecurity:   . Worried About Charity fundraiser in the Last Year:   . Arboriculturist in the Last Year:   Transportation Needs:   . Film/video editor (Medical):   Marland Kitchen Lack of Transportation (Non-Medical):   Physical Activity:   . Days of Exercise per Week:   . Minutes of Exercise per Session:   Stress:   . Feeling of Stress :   Social Connections:   . Frequency of Communication with Friends and Family:   . Frequency of Social Gatherings with Friends and Family:   . Attends Religious Services:   . Active Member of Clubs or Organizations:   . Attends Archivist Meetings:   Marland Kitchen Marital Status:   Intimate Partner Violence:   . Fear of Current or Ex-Partner:   . Emotionally Abused:   Marland Kitchen Physically Abused:   . Sexually Abused:       Family History  Problem Relation Age of Onset  . Cancer Mother   . Healthy Mother   . Breast cancer Mother   . Hyperlipidemia Father   . Hypertension Father   . Healthy Sister   . Cancer Maternal Grandmother   . Stroke Maternal Grandfather   . Hyperlipidemia Paternal Grandfather   . Colon polyps Maternal Uncle   . Colon cancer Neg Hx   . Esophageal cancer Neg Hx   . Rectal cancer Neg Hx   . Stomach cancer Neg Hx     Vitals:   01/09/20 1450  BP: 125/80  Pulse: 68  SpO2: 98%  Weight: 77.9 kg (171 lb 12.8 oz)    PHYSICAL EXAM: General:  Fit appearing. No respiratory difficulty HEENT:  normal Neck: supple. no JVD. Carotids 2+ bilat; no bruits. No lymphadenopathy or thryomegaly appreciated. Cor: PMI nondisplaced. Sound bigeminal Late systolic 2/6 MR  Lungs: clear Abdomen: soft, nontender, nondistended. No hepatosplenomegaly. No bruits or masses. Good bowel sounds. Extremities: no cyanosis, clubbing, rash, edema Neuro: alert & oriented x 3, cranial nerves grossly intact. moves all 4 extremities w/o difficulty. Affect pleasant.  ECG: NSR with occasional PACs   ASSESSMENT & PLAN:  1) Mitral regurgitation  - Resting echo, TEE, stress echo and cath studies viewed personally - Due to Barlow's disease - Resting echo with EF 40-45% with moderate to severe MR but when sedated/unloaded MR seems much less severe on cath and intra-op TEE - CPX excellent functional capacity with mildly elevated VE/VCO2 slope suggested of increased pulmonary pressures with exercise - Stress echo with moderate MR at baseline. Mildly worse with exercise - I discussed the case with Dr. Roxy Manns today and we both agree that it is not a matter of 'if" but "when" he will need to have his valve fixed. That said, given his high functional capacity currently, even with moderate to severe MR it is unlikely that repair will improve his exercise tolerance and may even worsen it so close watchful waiting may be best but we will need to follow closely to avoid deterioration of LV function - Will plan exercise RHC to further assess pulmonary pressures (and v-waves) with exercise to potentially assist with timing - Consider addition of losartan  2) Palpitations - with his degree of MR and LAE he is at high risk for AF and other atrial dysrhythmias - will place 14-day ziopatch  Total time spent 45 minutes. Over half that time spent discussing above.  Glori Bickers, MD  1:20 AM

## 2020-01-09 NOTE — Patient Instructions (Signed)
Your provider has recommended that  you wear a Zio Patch for 14 days.  This monitor will record your heart rhythm for our review.  IF you have any symptoms while wearing the monitor please press the button.  If you have any issues with the patch or you notice a red or orange light on it please call the company at [phone].  Once you remove the patch please mail it back to the company as soon as possible so we can get the results.  We will work on scheduling your heart catheterization and will call you with date, time and instructions  Your physician recommends that you schedule a follow-up appointment in: 3 months  If you have any questions or concerns before your next appointment please send [place] a message through [place] or call our office at [phone].    TO LEAVE A MESSAGE FOR THE NURSE SELECT OPTION 2, PLEASE LEAVE A MESSAGE INCLUDING: . YOUR NAME . DATE OF BIRTH . CALL BACK NUMBER . REASON FOR CALL**this is important as we prioritize the call backs  YOU WILL RECEIVE A CALL BACK THE SAME DAY AS LONG AS YOU CALL BEFORE 4:00 PM

## 2020-01-12 ENCOUNTER — Ambulatory Visit (INDEPENDENT_AMBULATORY_CARE_PROVIDER_SITE_OTHER): Payer: BC Managed Care – PPO | Admitting: Internal Medicine

## 2020-01-12 ENCOUNTER — Telehealth: Payer: Self-pay | Admitting: Internal Medicine

## 2020-01-12 ENCOUNTER — Other Ambulatory Visit: Payer: Self-pay

## 2020-01-12 ENCOUNTER — Ambulatory Visit (INDEPENDENT_AMBULATORY_CARE_PROVIDER_SITE_OTHER)
Admission: RE | Admit: 2020-01-12 | Discharge: 2020-01-12 | Disposition: A | Discharge status: Home / Self Care | Payer: BC Managed Care – PPO | Source: Ambulatory Visit | Attending: Internal Medicine | Admitting: Internal Medicine

## 2020-01-12 DIAGNOSIS — J984 Other disorders of lung: Secondary | ICD-10-CM | POA: Diagnosis not present

## 2020-01-12 DIAGNOSIS — R0609 Other forms of dyspnea: Secondary | ICD-10-CM

## 2020-01-12 DIAGNOSIS — R0600 Dyspnea, unspecified: Secondary | ICD-10-CM

## 2020-01-12 LAB — PULMONARY FUNCTION TEST
DL/VA % pred: 126 %
DL/VA: 5.71 ml/min/mmHg/L
DLCO cor % pred: 148 %
DLCO cor: 38.45 ml/min/mmHg
DLCO unc % pred: 148 %
DLCO unc: 38.45 ml/min/mmHg
FEF 25-75 Post: 3.63 L/sec
FEF 25-75 Pre: 2.88 L/sec
FEF2575-%Change-Post: 26 %
FEF2575-%Pred-Post: 116 %
FEF2575-%Pred-Pre: 92 %
FEV1-%Change-Post: 6 %
FEV1-%Pred-Post: 116 %
FEV1-%Pred-Pre: 109 %
FEV1-Post: 3.99 L
FEV1-Pre: 3.74 L
FEV1FVC-%Change-Post: 5 %
FEV1FVC-%Pred-Pre: 97 %
FEV6-%Change-Post: 1 %
FEV6-%Pred-Post: 118 %
FEV6-%Pred-Pre: 116 %
FEV6-Post: 5.01 L
FEV6-Pre: 4.95 L
FEV6FVC-%Pred-Post: 103 %
FEV6FVC-%Pred-Pre: 103 %
FVC-%Change-Post: 1 %
FVC-%Pred-Post: 113 %
FVC-%Pred-Pre: 112 %
FVC-Post: 5.01 L
FVC-Pre: 4.95 L
Post FEV1/FVC ratio: 80 %
Post FEV6/FVC ratio: 100 %
Pre FEV1/FVC ratio: 76 %
Pre FEV6/FVC Ratio: 100 %
RV % pred: 128 %
RV: 2.34 L
TLC % pred: 118 %
TLC: 7.3 L

## 2020-01-12 NOTE — Telephone Encounter (Signed)
Spoke to patient afte PFT. Normal +/- hyperinfilation. Will check FeNO test during face to face visit. He wants to hold off on ICS till he sees me and get Zio data in interim

## 2020-01-12 NOTE — Progress Notes (Signed)
Full PFT performed today. °

## 2020-01-12 NOTE — Telephone Encounter (Addendum)
Noted. Order has been placed for the PFT. Nothing further needed.

## 2020-01-12 NOTE — Addendum Note (Signed)
Addended by: Lorretta Harp on: 01/12/2020 02:39 PM   Modules accepted: Orders

## 2020-01-12 NOTE — Telephone Encounter (Signed)
Pt called back about this-- I scheduled full PFT for today 7/30 at 4pm

## 2020-01-12 NOTE — Telephone Encounter (Signed)
Visualized HRCT -> final report not back yet. Overall looks normal but for some scattered strands.  Plan  = can he do full PFT before 01/30/20 visit with me  - await HRCT results

## 2020-01-12 NOTE — Telephone Encounter (Signed)
Attempted to call pt but unable to reach. Left message for her to return call. 

## 2020-01-18 NOTE — Progress Notes (Signed)
Plse let him know CT chest essentiall normal. Will discuss mid august 2021 visit. He can call me today directly if he wants to discuss further

## 2020-01-18 NOTE — Progress Notes (Signed)
Called and left message to office call back on voicemail.

## 2020-01-29 ENCOUNTER — Other Ambulatory Visit (HOSPITAL_COMMUNITY): Payer: Self-pay | Admitting: *Deleted

## 2020-01-29 ENCOUNTER — Telehealth (HOSPITAL_COMMUNITY): Payer: Self-pay | Admitting: *Deleted

## 2020-01-29 DIAGNOSIS — I340 Nonrheumatic mitral (valve) insufficiency: Secondary | ICD-10-CM

## 2020-01-29 MED ORDER — SODIUM CHLORIDE 0.9% FLUSH: 3.0000 mL | Freq: Two times a day (BID) | INTRAVENOUS | Status: DC

## 2020-01-29 NOTE — Telephone Encounter (Signed)
Called pt to sch his RHC w/exercise. Pt sch for Fri 8/20 at 10:30 AM, covid test sch for 8/19, instructions reviewed with pt via phone

## 2020-01-30 ENCOUNTER — Ambulatory Visit (INDEPENDENT_AMBULATORY_CARE_PROVIDER_SITE_OTHER): Payer: BC Managed Care – PPO | Admitting: Internal Medicine

## 2020-01-30 ENCOUNTER — Other Ambulatory Visit: Payer: Self-pay

## 2020-01-30 ENCOUNTER — Encounter: Payer: Self-pay | Admitting: Internal Medicine

## 2020-01-30 VITALS — BP 130/74 | HR 58 | Ht 65.0 in | Wt 172.4 lb

## 2020-01-30 DIAGNOSIS — R0600 Dyspnea, unspecified: Secondary | ICD-10-CM | POA: Diagnosis not present

## 2020-01-30 DIAGNOSIS — R062 Wheezing: Secondary | ICD-10-CM

## 2020-01-30 DIAGNOSIS — R42 Dizziness and giddiness: Secondary | ICD-10-CM

## 2020-01-30 DIAGNOSIS — R002 Palpitations: Secondary | ICD-10-CM | POA: Diagnosis not present

## 2020-01-30 DIAGNOSIS — R0789 Other chest pain: Secondary | ICD-10-CM | POA: Diagnosis not present

## 2020-01-30 LAB — NITRIC OXIDE: Nitric Oxide: 18

## 2020-01-30 NOTE — Patient Instructions (Addendum)
ICD-10-CM   1. Dyspnea, unspecified type  R06.00 Nitric oxide  2. Wheezing  R06.2   3. Palpitations  R00.2   4. Chest tightness  R07.89   5. Dizziness  R42    Still unclear constellation beyond the mitral valve issues  Differentials include    - asthma (though FeNO test normal), Vocal Cord issues, pulmonary hypertension, POTS syndrome  Plan  - check cbc with diff, blood IgE  - await right heart cath later this week - await results of zio monitor - after cardiac workup complete will confer with Dr Haroldine Laws   - regarding POTS or evaluation for vocal cords [facility] preferred) or RAST allergy panel  - get flu shot in fall  FOllowup  - stay in touch

## 2020-01-30 NOTE — Progress Notes (Signed)
OV 01/30/2020  Subjective:  Patient ID: Kevin Medina, male , DOB: [birth year] , age 49 y.o. , MRN: [id number] , ADDRESS: [place] [address]   01/30/2020 -   Chief Complaint  Patient presents with  . Consult    Pt is here for a consult due to history of covid and SOB. Pt states the SOB can happen at various times and also has problems with a strong-pounding heartbeat. Pt denies any complaints of cough. Pt does have an occassional wheeze on exhalation when he works out.     HPI THERESA DOHRMAN 49 y.o. -is a well-known local acupuncturist/dry needling expert.  He presents because of multiple symptoms of shortness of breath, wheezing, chest tightness palpitations and dizziness.  He tells me that at baseline he is extremely fit and does CrossFit 4 days a week and running 5 km / 3 miles 2 days a week with 1 day rest.  In October 2020 had Covid infection that was treated and he recovered as an outpatient.  Prior to Covid with his Covid very occasionally he would take his blood on a heavy workloads and occasionally he would wheeze at the end of her run or exertion.  Other than that he was doing really well.  He was unaware of any health issues.  Then after Covid 19 infection in October 2020 he noticed 1 week after that he had some difficulty running and keeping pace but by 1 month after that he was back to normal at full exercise capacity with both CrossFit and doing his 3 mile run and 9 minutes.  Then sometime in late December 2020 early January 2021 during a warm up at [facility] he suddenly became short of breath had palpitations became dizzy and went to the ground.  He had the symptoms all week.  His primary care physician gave him some albuterol which seemed to help the shortness of breath.  Since then he has been using albuterol as needed but in July 2021 he stopped using it because his other symptoms are persistent.  He realized that the albuterol really did not  impact the improvement he had had.  His improvement was sustained with shortness of breath.  His other symptoms have persisted.  In particular they are palpitations that happen randomly.  The shortness of breath that also happens randomly including sitting and for minimal exertion.  He has chest tightness and fatigue but also happen randomly.  He might get the symptoms at rest but then not so much when he exercises.  However he does notice that his CrossFit capacity is only 80% of his pre-Covid baseline and immediate post Covid baseline.  He is also a couple of minutes lower on his 3 mile run.  The only symptom that is not present at random is dizziness that happens during weight lifting and also wheezing that happens after heavy exertion and a run.  The wheezing is usually with expiration.  The symptoms resulted in a echocardiogram on September 01, 2019 that showed ejection fraction of 40% that was depressed and moderate mitral valve regurgitation.  This then prompted referral to cardiology initially was established with Dr. Donnella Bi.  He had at TEE on October 05, 2019 that read demonstrated the mitral valve regurgitation and low ejection fraction of 45%.  He was then seen by Dr. Roxy Manns who took him to the OR for mitral valve repair on November 22, 2019.  Intraoperative TEE showed ejection fraction  had improved to 55%.  The procedure was then canceled.  He then underwent pulmonary stress testing on November 23, 2019.  He had a VO2 of 49.8 mL/kg/min which was 152% predicted than normal.  He had a peak heart rate of 169.  There was no post exercise bronchospasm challenge done.  However he did desaturate.  It was not clear if it was artifactual.  He says that at home in June 2021 finger pulse ox showed variability but 1 day when he tested it at [employer] at peak exercise he was still 100%.  However he is continued to have his ongoing symptoms as described above.  He is now established with Dr. Haroldine Laws in cardiology.  Stress  right heart catheterization is pending in a few days.  He has had a CO monitor the results are pending.  He had a stress echocardiogram on December 14, 2019.  He now has improved ejection fraction 55-60% at rest.  His pulmonary artery systolic pressure went from 13-21.  His moderate to severe mitral valve regurgitation persisted.  He did 17 METS which is considered excellent.  His baseline is not known.  Due to persistent symptoms he is present to the pulmonary clinic.  He tells me that he understand that with mitral valve regurgitation that his peak exercise capacity can be lower than predicted.  However he is worried about his resting symptoms.  They also happen sporadically and at random.  Last week he was in [place] altitude [place] 7500 [place] and he was short of breath this is not normal for him.  He is worried about the randomness of several symptoms including the exertional wheeze and the dizziness that happens with weight lifting.  He never had all the symptoms in the past.  He had recent high-resolution CT scan of the chest January 12, 2020: There is no evidence of interstitial lung disease.  He had pulmonary function test January 12, 2020: It shows normality and hyperinflation.  He has no history of asthma or allergies: We tested the exhaled nitric oxide test today and it was 18 and normal suggesting against eosinophilic airway inflammation    PFT Results Latest Ref Rng & Units 01/12/2020  FVC-Pre L 4.95  FVC-Predicted Pre % 112  FVC-Post L 5.01  FVC-Predicted Post % 113  Pre FEV1/FVC % % 76  Post FEV1/FCV % % 80  FEV1-Pre L 3.74  FEV1-Predicted Pre % 109  FEV1-Post L 3.99  DLCO uncorrected ml/min/mmHg 38.45  DLCO UNC% % 148  DLCO corrected ml/min/mmHg 38.45  DLCO COR %Predicted % 148  DLVA Predicted % 126  TLC L 7.30  TLC % Predicted % 118  RV % Predicted % 128      ROS - per HPI     has a past medical history of Allergy, Arrhythmia, Exercise-induced asthma, Heart murmur,  Hyperlipidemia, Mitral regurgitation, and Mitral valve prolapse.   reports that he quit smoking about 22 years ago. His smoking use included cigarettes. He has a 32.00 pack-year smoking history. He has quit using smokeless tobacco.  Past Surgical History:  Procedure Laterality Date  . COLONOSCOPY    . POLYPECTOMY  15 yrs ago    ? type of polyps per pt   . RIGHT/LEFT HEART CATH AND CORONARY ANGIOGRAPHY N/A 10/05/2019   Procedure: RIGHT/LEFT HEART CATH AND CORONARY ANGIOGRAPHY;  Surgeon: Lorretta Harp, MD;  Location: [place] [facility];  Service: Cardiovascular;  Laterality: N/A;  . TEE WITHOUT CARDIOVERSION N/A 10/05/2019  Procedure: TRANSESOPHAGEAL ECHOCARDIOGRAM (TEE);  Surgeon: Lelon Perla, MD;  Location[facility] ENDOSCOPY;  Service: Cardiovascular;  Laterality: N/A;  . TEE WITHOUT CARDIOVERSION N/A 11/22/2019   Procedure: TRANSESOPHAGEAL ECHOCARDIOGRAM (TEE);  Surgeon: Rexene Alberts, MD;  Location: [place];  Service: Open Heart Surgery;  Laterality: N/A;  . VASECTOMY      Allergies  Allergen Reactions  . Tape Itching and Rash    Immunization History  Administered Date(s) Administered  . Janssen (J&J) SARS-COV-2 Vaccination 08/26/2019  . Tdap 07/14/2019    Family History  Problem Relation Age of Onset  . Cancer Mother   . Healthy Mother   . Breast cancer Mother   . Hyperlipidemia Father   . Hypertension Father   . Healthy Sister   . Cancer Maternal Grandmother   . Stroke Maternal Grandfather   . Hyperlipidemia Paternal Grandfather   . Colon polyps Maternal Uncle   . Colon cancer Neg Hx   . Esophageal cancer Neg Hx   . Rectal cancer Neg Hx   . Stomach cancer Neg Hx      Current Outpatient Medications:  .  Digestive Enzymes TABS, Take 1 tablet by mouth daily. , Disp: , Rfl:  .  fish oil-omega-3 fatty acids 1000 MG capsule, Take 2,000 mg by mouth daily. , Disp: , Rfl:  .  Multiple Vitamin (MULTIVITAMIN) tablet, Take 1 tablet by mouth daily., Disp: , Rfl:  .   psyllium (METAMUCIL) 58.6 % packet, Take 1 packet by mouth daily., Disp: , Rfl:  .  Turmeric 500 MG CAPS, Take 1,000 mg by mouth in the morning and at bedtime. , Disp: , Rfl:   Current Facility-Administered Medications:  .  sodium chloride flush (NS) 0.9 % injection 3 mL, 3 mL, Intravenous, Q12H, Lorretta Harp, MD      Objective:   Vitals:   01/30/20 1610  BP: 130/74  Pulse: (!) 58  SpO2: 98%  Weight: 172 lb 6.4 oz (78.2 kg)  Height: 5\' 5"  (1.651 m)    Estimated body mass index is 28.69 kg/m as calculated from the following:   Height as of this encounter: 5\' 5"  (1.651 m).   Weight as of this encounter: 172 lb 6.4 oz (78.2 kg).  @WEIGHTCHANGE @  [employer]   01/30/20 1610  Weight: 172 lb 6.4 oz (78.2 kg)     Physical Exam  General Appearance:    Alert, cooperative, no distress, appears stated age - yes , Deconditioned looking - no , OBESE  - no, Sitting on Wheelchair -  no  Head:    Normocephalic, without obvious abnormality, atraumatic  Eyes:    PERRL, conjunctiva/corneas clear,  Ears:    Normal TM's and external ear canals, both ears  Nose:   Nares normal, septum midline, mucosa normal, no drainage    or sinus tenderness. OXYGEN ON  - no . Patient is @ ra   Throat:   Lips, mucosa, and tongue normal; teeth and gums normal. Cyanosis on lips - n  Neck:   Supple, symmetrical, trachea midline, no adenopathy;    thyroid:  no enlargement/tenderness/nodules; no carotid   bruit or JVD  Back:     Symmetric, no curvature, ROM normal, no CVA tenderness  Lungs:     Distress - no , Wheeze no, Barrell Chest - no, Purse lip breathing - no, Crackles - no   Chest Wall:    No tenderness or deformity.    Heart:    Regular rate and rhythm,  S1 and S2 normal, no rub   or gallop, Murmur - no  Breast Exam:    NOT DONE  Abdomen:     Soft, non-tender, bowel sounds active all four quadrants,    no masses, no organomegaly. Visceral obesity - no  Genitalia:   NOT DONE  Rectal:   NOT DONE   Extremities:   Extremities - normal, Has Cane - no, Clubbing - no, Edema - no  Pulses:   2+ and symmetric all extremities  Skin:   Stigmata of Connective Tissue Disease - no  Lymph nodes:   Cervical, supraclavicular, and axillary nodes normal  Psychiatric:  Neurologic:   Pleasant - yes, Anxious - no, Flat affect - no  CAm-ICU - neg, Alert and Oriented x 3 - yes, Moves all 4s - yes, Speech - normal, Cognition - intact           Assessment:       ICD-10-CM   1. Dyspnea, unspecified type  R06.00 Nitric oxide    CBC w/Diff    IgE  2. Wheezing  R06.2 CBC w/Diff    IgE  3. Palpitations  R00.2 CBC w/Diff    IgE  4. Chest tightness  R07.89 CBC w/Diff    IgE  5. Dizziness  R42 CBC w/Diff    IgE       Plan:     Patient Instructions     ICD-10-CM   1. Dyspnea, unspecified type  R06.00 Nitric oxide  2. Wheezing  R06.2   3. Palpitations  R00.2   4. Chest tightness  R07.89   5. Dizziness  R42    Still unclear constellation beyond the mitral valve issues  Differentials include    - asthma (though FeNO test normal), Vocal Cord issues, pulmonary hypertension, POTS syndrome  Plan  - check cbc with diff, blood IgE  - await right heart cath later this week - await results of zio monitor - after cardiac workup complete will confer with Dr Haroldine Laws   - regarding POTS or evaluation for vocal cords [facility] preferred) or RAST allergy panel  - get flu shot in fall  FOllowup  - stay in touch    ( Level 05 visit:  New 60-74 min   in  visit type: on-site physical face to visit  in total care time and counseling or/and coordination of care by this undersigned MD - Dr Brand Males. This includes one or more of the following on this same day 01/30/2020: pre-charting, chart review, note writing, documentation discussion of test results, diagnostic or treatment recommendations, prognosis, risks and benefits of management options, instructions, education, compliance or risk-factor  reduction. It excludes time spent by the Powers or office staff in the care of the patient. Actual time 68 min)   SIGNATURE    Dr. Brand Males, M.D., F.C.C.P,  Pulmonary and Critical Care Medicine Staff Physician, [place] Director - Interstitial Lung Disease  Program  Pulmonary [place] [facility] [place], [place], [address]  Pager: [phone], If no answer or between  15:00h - 7:00h: call [phone] Telephone: [phone]  5:33 PM 01/30/2020

## 2020-01-31 ENCOUNTER — Other Ambulatory Visit (INDEPENDENT_AMBULATORY_CARE_PROVIDER_SITE_OTHER): Payer: BC Managed Care – PPO

## 2020-01-31 DIAGNOSIS — R062 Wheezing: Secondary | ICD-10-CM | POA: Diagnosis not present

## 2020-01-31 DIAGNOSIS — R002 Palpitations: Secondary | ICD-10-CM

## 2020-01-31 DIAGNOSIS — R0600 Dyspnea, unspecified: Secondary | ICD-10-CM

## 2020-01-31 DIAGNOSIS — R42 Dizziness and giddiness: Secondary | ICD-10-CM

## 2020-01-31 DIAGNOSIS — R0789 Other chest pain: Secondary | ICD-10-CM | POA: Diagnosis not present

## 2020-01-31 LAB — CBC WITH DIFFERENTIAL/PLATELET
Basophils Absolute: 0 10*3/uL (ref 0.0–0.1)
Basophils Relative: 0.5 % (ref 0.0–3.0)
Eosinophils Absolute: 0 10*3/uL (ref 0.0–0.7)
Eosinophils Relative: 0.7 % (ref 0.0–5.0)
HCT: 41.6 % (ref 39.0–52.0)
Hemoglobin: 14 g/dL (ref 13.0–17.0)
Lymphocytes Relative: 32.8 % (ref 12.0–46.0)
Lymphs Abs: 1.9 10*3/uL (ref 0.7–4.0)
MCHC: 33.6 g/dL (ref 30.0–36.0)
MCV: 93.4 fl (ref 78.0–100.0)
Monocytes Absolute: 0.5 10*3/uL (ref 0.1–1.0)
Monocytes Relative: 8.2 % (ref 3.0–12.0)
Neutro Abs: 3.4 10*3/uL (ref 1.4–7.7)
Neutrophils Relative %: 57.8 % (ref 43.0–77.0)
Platelets: 233 10*3/uL (ref 150.0–400.0)
RBC: 4.45 Mil/uL (ref 4.22–5.81)
RDW: 13.1 % (ref 11.5–15.5)
WBC: 5.9 10*3/uL (ref 4.0–10.5)

## 2020-02-01 ENCOUNTER — Other Ambulatory Visit (HOSPITAL_COMMUNITY)
Admission: RE | Admit: 2020-02-01 | Discharge: 2020-02-01 | Disposition: A | Discharge status: Home / Self Care | Payer: BC Managed Care – PPO | Source: Ambulatory Visit | Attending: Internal Medicine | Admitting: Internal Medicine

## 2020-02-01 DIAGNOSIS — Z20822 Contact with and (suspected) exposure to covid-19: Secondary | ICD-10-CM | POA: Insufficient documentation

## 2020-02-01 DIAGNOSIS — Z01812 Encounter for preprocedural laboratory examination: Secondary | ICD-10-CM | POA: Insufficient documentation

## 2020-02-01 LAB — SARS CORONAVIRUS 2 (TAT 6-24 HRS): SARS Coronavirus 2: NEGATIVE

## 2020-02-01 LAB — IGE: IgE (Immunoglobulin E), Serum: 58 kU/L (ref <=114)

## 2020-02-02 ENCOUNTER — Other Ambulatory Visit: Payer: Self-pay

## 2020-02-02 ENCOUNTER — Ambulatory Visit (HOSPITAL_COMMUNITY)
Admission: RE | Admit: 2020-02-02 | Discharge: 2020-02-02 | Disposition: A | Discharge status: Home / Self Care | Payer: BC Managed Care – PPO | Attending: Internal Medicine | Admitting: Internal Medicine

## 2020-02-02 ENCOUNTER — Ambulatory Visit (HOSPITAL_COMMUNITY)
Admission: RE | Disposition: A | Discharge status: Home / Self Care | Payer: Self-pay | Source: Home / Self Care | Attending: Internal Medicine

## 2020-02-02 DIAGNOSIS — Z8249 Family history of ischemic heart disease and other diseases of the circulatory system: Secondary | ICD-10-CM | POA: Diagnosis not present

## 2020-02-02 DIAGNOSIS — E785 Hyperlipidemia, unspecified: Secondary | ICD-10-CM | POA: Insufficient documentation

## 2020-02-02 DIAGNOSIS — Z87891 Personal history of nicotine dependence: Secondary | ICD-10-CM | POA: Diagnosis not present

## 2020-02-02 DIAGNOSIS — R002 Palpitations: Secondary | ICD-10-CM | POA: Diagnosis not present

## 2020-02-02 DIAGNOSIS — I340 Nonrheumatic mitral (valve) insufficiency: Secondary | ICD-10-CM | POA: Diagnosis not present

## 2020-02-02 HISTORY — PX: RIGHT HEART CATH: CATH118263

## 2020-02-02 LAB — POCT I-STAT 7, (LYTES, BLD GAS, ICA,H+H)
Acid-Base Excess: 1 mmol/L (ref 0.0–2.0)
Acid-base deficit: 5 mmol/L — ABNORMAL HIGH (ref 0.0–2.0)
Bicarbonate: 19.2 mmol/L — ABNORMAL LOW (ref 20.0–28.0)
Bicarbonate: 26.3 mmol/L (ref 20.0–28.0)
Calcium, Ion: 1.08 mmol/L — ABNORMAL LOW (ref 1.15–1.40)
Calcium, Ion: 1.2 mmol/L (ref 1.15–1.40)
HCT: 44 % (ref 39.0–52.0)
HCT: 45 % (ref 39.0–52.0)
Hemoglobin: 15 g/dL (ref 13.0–17.0)
Hemoglobin: 15.3 g/dL (ref 13.0–17.0)
O2 Saturation: 98 %
O2 Saturation: 98 %
Potassium: 4.5 mmol/L (ref 3.5–5.1)
Potassium: 4.9 mmol/L (ref 3.5–5.1)
Sodium: 142 mmol/L (ref 135–145)
Sodium: 146 mmol/L — ABNORMAL HIGH (ref 135–145)
TCO2: 20 mmol/L — ABNORMAL LOW (ref 22–32)
TCO2: 28 mmol/L (ref 22–32)
pCO2 arterial: 33.7 mmHg (ref 32.0–48.0)
pCO2 arterial: 41.6 mmHg (ref 32.0–48.0)
pH, Arterial: 7.364 (ref 7.350–7.450)
pH, Arterial: 7.409 (ref 7.350–7.450)
pO2, Arterial: 100 mmHg (ref 83.0–108.0)
pO2, Arterial: 97 mmHg (ref 83.0–108.0)

## 2020-02-02 LAB — POCT I-STAT EG7
Acid-Base Excess: 1 mmol/L (ref 0.0–2.0)
Acid-Base Excess: 2 mmol/L (ref 0.0–2.0)
Acid-base deficit: 3 mmol/L — ABNORMAL HIGH (ref 0.0–2.0)
Bicarbonate: 24.9 mmol/L (ref 20.0–28.0)
Bicarbonate: 25.8 mmol/L (ref 20.0–28.0)
Bicarbonate: 28 mmol/L (ref 20.0–28.0)
Calcium, Ion: 1.03 mmol/L — ABNORMAL LOW (ref 1.15–1.40)
Calcium, Ion: 1.03 mmol/L — ABNORMAL LOW (ref 1.15–1.40)
Calcium, Ion: 1.08 mmol/L — ABNORMAL LOW (ref 1.15–1.40)
HCT: 41 % (ref 39.0–52.0)
HCT: 43 % (ref 39.0–52.0)
HCT: 45 % (ref 39.0–52.0)
Hemoglobin: 13.9 g/dL (ref 13.0–17.0)
Hemoglobin: 14.6 g/dL (ref 13.0–17.0)
Hemoglobin: 15.3 g/dL (ref 13.0–17.0)
O2 Saturation: 32 %
O2 Saturation: 50 %
O2 Saturation: 75 %
Potassium: 3.5 mmol/L (ref 3.5–5.1)
Potassium: 4.1 mmol/L (ref 3.5–5.1)
Potassium: 4.8 mmol/L (ref 3.5–5.1)
Sodium: 144 mmol/L (ref 135–145)
Sodium: 146 mmol/L — ABNORMAL HIGH (ref 135–145)
Sodium: 149 mmol/L — ABNORMAL HIGH (ref 135–145)
TCO2: 27 mmol/L (ref 22–32)
TCO2: 27 mmol/L (ref 22–32)
TCO2: 29 mmol/L (ref 22–32)
pCO2, Ven: 41.4 mmHg — ABNORMAL LOW (ref 44.0–60.0)
pCO2, Ven: 49 mmHg (ref 44.0–60.0)
pCO2, Ven: 55.5 mmHg (ref 44.0–60.0)
pH, Ven: 7.261 (ref 7.250–7.430)
pH, Ven: 7.365 (ref 7.250–7.430)
pH, Ven: 7.403 (ref 7.250–7.430)
pO2, Ven: 23 mmHg — CL (ref 32.0–45.0)
pO2, Ven: 28 mmHg — CL (ref 32.0–45.0)
pO2, Ven: 40 mmHg (ref 32.0–45.0)

## 2020-02-02 LAB — BASIC METABOLIC PANEL
Anion gap: 8 (ref 5–15)
BUN: 13 mg/dL (ref 6–20)
CO2: 28 mmol/L (ref 22–32)
Calcium: 9.6 mg/dL (ref 8.9–10.3)
Chloride: 104 mmol/L (ref 98–111)
Creatinine, Ser: 1.24 mg/dL (ref 0.61–1.24)
GFR calc Af Amer: >60 mL/min (ref >60)
GFR calc non Af Amer: >60 mL/min (ref >60)
Glucose, Bld: 97 mg/dL (ref 70–99)
Potassium: 4.7 mmol/L (ref 3.5–5.1)
Sodium: 140 mmol/L (ref 135–145)

## 2020-02-02 LAB — CBC
HCT: 45.6 % (ref 39.0–52.0)
Hemoglobin: 15.2 g/dL (ref 13.0–17.0)
MCH: 30.8 pg (ref 26.0–34.0)
MCHC: 33.3 g/dL (ref 30.0–36.0)
MCV: 92.3 fL (ref 80.0–100.0)
Platelets: 234 10*3/uL (ref 150–400)
RBC: 4.94 MIL/uL (ref 4.22–5.81)
RDW: 12.4 % (ref 11.5–15.5)
WBC: 4.8 10*3/uL (ref 4.0–10.5)
nRBC: 0 % (ref 0.0–0.2)

## 2020-02-02 SURGERY — RIGHT HEART CATH
Anesthesia: LOCAL

## 2020-02-02 MED ORDER — SODIUM CHLORIDE 0.9% FLUSH: 3.0000 mL | Freq: Two times a day (BID) | INTRAVENOUS | Status: DC

## 2020-02-02 MED ORDER — ACETAMINOPHEN 325 MG PO TABS: 650.0000 mg | ORAL_TABLET | ORAL | Status: DC | PRN

## 2020-02-02 MED ORDER — SODIUM CHLORIDE 0.9% FLUSH: 3.0000 mL | INTRAVENOUS | Status: DC | PRN

## 2020-02-02 MED ORDER — HEPARIN SODIUM (PORCINE) 1000 UNIT/ML IJ SOLN
INTRAMUSCULAR | Status: DC | PRN
  Administered 2020-02-02: 2000 [IU] via INTRAVENOUS

## 2020-02-02 MED ORDER — HEPARIN (PORCINE) IN NACL 1000-0.9 UT/500ML-% IV SOLN
INTRAVENOUS | Status: DC | PRN
  Administered 2020-02-02: 500 mL

## 2020-02-02 MED ORDER — HYDRALAZINE HCL 20 MG/ML IJ SOLN: 10.0000 mg | INTRAMUSCULAR | Status: DC | PRN

## 2020-02-02 MED ORDER — HEPARIN SODIUM (PORCINE) 1000 UNIT/ML IJ SOLN
INTRAMUSCULAR | Status: AC
  Filled 2020-02-02: qty 1

## 2020-02-02 MED ORDER — LABETALOL HCL 5 MG/ML IV SOLN: 10.0000 mg | INTRAVENOUS | Status: DC | PRN

## 2020-02-02 MED ORDER — SODIUM CHLORIDE 0.9 % IV SOLN: 250.0000 mL | INTRAVENOUS | Status: DC | PRN

## 2020-02-02 MED ORDER — ASPIRIN 81 MG PO CHEW
81.0000 mg | CHEWABLE_TABLET | ORAL | Status: AC
  Administered 2020-02-02: 81 mg via ORAL
  Filled 2020-02-02: qty 1

## 2020-02-02 MED ORDER — SODIUM CHLORIDE 0.9 % IV SOLN: INTRAVENOUS | Status: DC

## 2020-02-02 MED ORDER — HEPARIN (PORCINE) IN NACL 1000-0.9 UT/500ML-% IV SOLN
INTRAVENOUS | Status: AC
  Filled 2020-02-02: qty 1000

## 2020-02-02 MED ORDER — LIDOCAINE HCL (PF) 1 % IJ SOLN
INTRAMUSCULAR | Status: DC | PRN
  Administered 2020-02-02 (×2): 2 mL via SUBCUTANEOUS

## 2020-02-02 MED ORDER — LIDOCAINE HCL (PF) 1 % IJ SOLN
INTRAMUSCULAR | Status: AC
  Filled 2020-02-02: qty 30

## 2020-02-02 MED ORDER — ONDANSETRON HCL 4 MG/2ML IJ SOLN: 4.0000 mg | Freq: Four times a day (QID) | INTRAMUSCULAR | Status: DC | PRN

## 2020-02-02 SURGICAL SUPPLY — 9 items
CATH SWAN GANZ 7F STRAIGHT (CATHETERS) ×2 IMPLANT
DEVICE RAD COMP TR BAND LRG (VASCULAR PRODUCTS) ×2 IMPLANT
GLIDESHEATH SLENDER 7FR .021G (SHEATH) ×2 IMPLANT
PACK CARDIAC CATHETERIZATION (CUSTOM PROCEDURE TRAY) ×2 IMPLANT
SHEATH GLIDE SLENDER 4/5FR (SHEATH) ×2 IMPLANT
TRANSDUCER W/STOPCOCK (MISCELLANEOUS) ×4 IMPLANT
TUBING ART PRESS 72  MALE/FEM (TUBING) ×2
TUBING ART PRESS 72 MALE/FEM (TUBING) ×2 IMPLANT
WIRE EMERALD 3MM-J .025X260CM (WIRE) ×2 IMPLANT

## 2020-02-02 NOTE — Discharge Instructions (Signed)
Radial Site Care  This sheet gives you information about how to care for yourself after your procedure. Your health care provider may also give you more specific instructions. If you have problems or questions, contact your health care provider. What can I expect after the procedure? After the procedure, it is common to have:  Bruising and tenderness at the catheter insertion area. Follow these instructions at home: Medicines  Take over-the-counter and prescription medicines only as told by your health care provider. Insertion site care  Follow instructions from your health care provider about how to take care of your insertion site. Make sure you: ? Wash your hands with soap and water before you change your bandage (dressing). If soap and water are not available, use hand sanitizer. ? Change your dressing as told by your health care provider. ? Leave stitches (sutures), skin glue, or adhesive strips in place. These skin closures may need to stay in place for 2 weeks or longer. If adhesive strip edges start to loosen and curl up, you may trim the loose edges. Do not remove adhesive strips completely unless your health care provider tells you to do that.  Check your insertion site every day for signs of infection. Check for: ? Redness, swelling, or pain. ? Fluid or blood. ? Pus or a bad smell. ? Warmth.  Do not take baths, swim, or use a hot tub until your health care provider approves.  You may shower 24-48 hours after the procedure, or as directed by your health care provider. ? Remove the dressing and gently wash the site with plain soap and water. ? Pat the area dry with a clean towel. ? Do not rub the site. That could cause bleeding.  Do not apply powder or lotion to the site. Activity   For 24 hours after the procedure, or as directed by your health care provider: ? Do not flex or bend the affected arm. ? Do not push or pull heavy objects with the affected arm. ? Do not  drive yourself home from the hospital or clinic. You may drive 24 hours after the procedure unless your health care provider tells you not to. ? Do not operate machinery or power tools.  Do not lift anything that is heavier than 10 lb (4.5 kg), or the limit that you are told, until your health care provider says that it is safe.  Ask your health care provider when it is okay to: ? Return to work or school. ? Resume usual physical activities or sports. ? Resume sexual activity. General instructions  If the catheter site starts to bleed, raise your arm and put firm pressure on the site. If the bleeding does not stop, get help right away. This is a medical emergency.  If you went home on the same day as your procedure, a responsible adult should be with you for the first 24 hours after you arrive home.  Keep all follow-up visits as told by your health care provider. This is important. Contact a health care provider if:  You have a fever.  You have redness, swelling, or yellow drainage around your insertion site. Get help right away if:  You have unusual pain at the radial site.  The catheter insertion area swells very fast.  The insertion area is bleeding, and the bleeding does not stop when you hold steady pressure on the area.  Your arm or hand becomes pale, cool, tingly, or numb. These symptoms may represent a serious problem   that is an emergency. Do not wait to see if the symptoms will go away. Get medical help right away. Call your local emergency services (911 in the [place] Do not drive yourself to the hospital. Summary  After the procedure, it is common to have bruising and tenderness at the site.  Follow instructions from your health care provider about how to take care of your radial site wound. Check the wound every day for signs of infection.  Do not lift anything that is heavier than 10 lb (4.5 kg), or the limit that you are told, until your health care provider says  that it is safe. This information is not intended to replace advice given to you by your health care provider. Make sure you discuss any questions you have with your health care provider. Document Revised: 07/07/2017 Document Reviewed: 07/07/2017 Elsevier Patient Education  2020 Elsevier Inc.  

## 2020-02-02 NOTE — Research (Signed)
[place] Informed Consent   Subject Name: Kevin Medina  Subject met inclusion and exclusion criteria.  The informed consent form, study requirements and expectations were reviewed with the subject and questions and concerns were addressed prior to the signing of the consent form.  The subject verbalized understanding of the trail requirements.  The subject agreed to participate in the [facility] trial and signed the informed consent.  The informed consent was obtained prior to performance of any protocol-specific procedures for the subject.  A copy of the signed informed consent was given to the subject and a copy was placed in the subject's medical record.  Philemon Kingdom D 02/02/2020, 9:33 AM

## 2020-02-02 NOTE — Progress Notes (Signed)
Blood IgE and blood eos normal. Based on his remaining workup with cards we might get RAST allergy profile

## 2020-02-02 NOTE — Progress Notes (Signed)
Discharge instructions reviewed with patient and family. Verbalized understanding. MD also notified of heart rate being low (30's) while patient is asleep. Patient asymptomatic. No further orders received from MD.

## 2020-02-02 NOTE — Interval H&P Note (Signed)
History and Physical Interval Note:  02/02/2020 11:35 AM  Kevin Medina  has presented today for surgery, with the diagnosis of mr.  The various methods of treatment have been discussed with the patient and family. After consideration of risks, benefits and other options for treatment, the patient has consented to  Procedure(s): RIGHT HEART CATH (N/A) as a surgical intervention.  The patient's history has been reviewed, patient examined, no change in status, stable for surgery.  I have reviewed the patient's chart and labs.  Questions were answered to the patient's satisfaction.     Naiomy Watters

## 2020-02-05 ENCOUNTER — Encounter (HOSPITAL_COMMUNITY): Payer: Self-pay | Admitting: Internal Medicine

## 2020-02-05 DIAGNOSIS — R002 Palpitations: Secondary | ICD-10-CM | POA: Diagnosis not present

## 2020-02-05 MED FILL — Heparin Sod (Porcine)-NaCl IV Soln 1000 Unit/500ML-0.9%: INTRAVENOUS | Qty: 500 | Status: AC

## 2020-02-07 NOTE — Addendum Note (Signed)
Encounter addended by: Micki Riley, RN on: 02/07/2020 1:46 PM  Actions taken: Imaging Exam ended

## 2020-02-21 ENCOUNTER — Telehealth: Payer: Self-pay | Admitting: Internal Medicine

## 2020-02-21 NOTE — Telephone Encounter (Signed)
Not seeing a call placed to the pt  LMTCB to get more information

## 2020-02-22 NOTE — Telephone Encounter (Signed)
Left detailed message for pt's results. Pt is to call back to let us know if his work up with cards is complete. Per Dr. Golden Pop last OV note he will talk with Dr. Haroldine Laws. Will await pts call.   __________________    Blood IgE and blood eos normal. Based on his remaining workup with cards we might get RAST allergy profile

## 2020-02-23 NOTE — Telephone Encounter (Signed)
Pt returning call - states that as far as he knows cards is complete and Dr. Chase Caller can go ahead and talk with Dr. Haroldine Laws. Pt CB# [phone]

## 2020-02-23 NOTE — Telephone Encounter (Signed)
Routing to MR. 

## 2020-03-01 NOTE — Telephone Encounter (Signed)
Dr. Haroldine Laws in the right heart cath noticed mild hypertensive response and mild pulmonary hypertension with exercise.  Has recommended watchful waiting.  I will discuss with the patient

## 2020-04-26 ENCOUNTER — Encounter: Payer: Self-pay | Admitting: Thoracic Surgery (Cardiothoracic Vascular Surgery)

## 2020-05-02 ENCOUNTER — Other Ambulatory Visit: Payer: Self-pay | Admitting: Thoracic Surgery (Cardiothoracic Vascular Surgery)

## 2020-05-02 ENCOUNTER — Encounter: Payer: Self-pay | Admitting: *Deleted

## 2020-05-02 DIAGNOSIS — I340 Nonrheumatic mitral (valve) insufficiency: Secondary | ICD-10-CM

## 2020-05-30 ENCOUNTER — Telehealth (HOSPITAL_COMMUNITY): Payer: Self-pay | Admitting: Thoracic Surgery (Cardiothoracic Vascular Surgery)

## 2020-05-30 NOTE — Telephone Encounter (Signed)
Patient called and left a voicemail that he did not wish to schedule echocardiogram due to he was charged for a surgery at [employer] hat he did not have and did not feel very comfortable scheduling with [place].  Order will be removed from the WQ.

## 2020-06-03 ENCOUNTER — Ambulatory Visit: Payer: BC Managed Care – PPO | Admitting: Thoracic Surgery (Cardiothoracic Vascular Surgery)

## 2020-07-10 ENCOUNTER — Telehealth: Payer: Self-pay | Admitting: Cardiovascular Disease

## 2020-07-10 NOTE — Telephone Encounter (Signed)
   [facility] Pre-operative Risk Assessment    HEARTCARE STAFF: - Please ensure there is not already an duplicate clearance open for this procedure. - Under Visit Info/Reason for Call, type in Other and utilize the format Clearance MM/DD/YY or Clearance TBD. Do not use dashes or single digits. - If request is for dental extraction, please clarify the # of teeth to be extracted.  Request for surgical clearance:  1. What type of surgery is being performed? Cleaning   2. When is this surgery scheduled? Friday 07/12/2020  3. What type of clearance is required (medical clearance vs. Pharmacy clearance to hold med vs. Both)? medical  4. Are there any medications that need to be held prior to surgery and how long? No  5. Practice name and name of physician performing surgery? [facility]  6. What is the office phone number? [phone]   7.   What is the office fax number? [phone]  8.   Anesthesia type (None, local, MAC, general) ? None patient might need a pre medication   Kevin Medina 07/10/2020, 4:20 PM  _________________________________________________________________   (provider comments below)

## 2020-07-10 NOTE — Telephone Encounter (Signed)
   Primary Cardiologist: Quay Burow, MD  Chart reviewed as part of pre-operative protocol coverage. Simple dental extractions are considered low risk procedures per guidelines and generally do not require any specific cardiac clearance. It is also generally accepted that for simple extractions and dental cleanings, there is no need to interrupt blood thinner therapy.   SBE prophylaxis is not required for the patient.  I will route this recommendation to the requesting party via Epic fax function and remove from pre-op pool.  Please call with questions.  Deberah Pelton, NP 07/10/2020, 5:00 PM

## 2020-07-10 NOTE — Telephone Encounter (Signed)
Called the requesting office and was told that procedure will be a dental cleaning with bite wing x-rays.

## 2020-07-10 NOTE — Telephone Encounter (Signed)
Please provide details.  What type of cleaning as this?  Is this a dental cleaning?  Jossie Ng. Renne Platts NP-C    07/10/2020, 4:28 PM [address] Office [phone] Fax [phone]

## 2020-07-16 ENCOUNTER — Encounter: Payer: Self-pay | Admitting: Registered Nurse

## 2020-07-19 ENCOUNTER — Other Ambulatory Visit: Payer: Self-pay

## 2020-07-19 ENCOUNTER — Encounter: Payer: Self-pay | Admitting: Registered Nurse

## 2020-07-19 ENCOUNTER — Ambulatory Visit (INDEPENDENT_AMBULATORY_CARE_PROVIDER_SITE_OTHER): Payer: BC Managed Care – PPO | Admitting: Registered Nurse

## 2020-07-19 VITALS — BP 138/70 | HR 73 | Temp 98.0°F | Resp 18 | Ht 65.0 in | Wt 169.8 lb

## 2020-07-19 DIAGNOSIS — Z13228 Encounter for screening for other metabolic disorders: Secondary | ICD-10-CM

## 2020-07-19 DIAGNOSIS — Z130 Encounter for screening for diseases of the blood and blood-forming organs and certain disorders involving the immune mechanism: Secondary | ICD-10-CM

## 2020-07-19 DIAGNOSIS — Z0001 Encounter for general adult medical examination with abnormal findings: Secondary | ICD-10-CM | POA: Diagnosis not present

## 2020-07-19 DIAGNOSIS — I341 Nonrheumatic mitral (valve) prolapse: Secondary | ICD-10-CM

## 2020-07-19 DIAGNOSIS — Z1329 Encounter for screening for other suspected endocrine disorder: Secondary | ICD-10-CM | POA: Diagnosis not present

## 2020-07-19 DIAGNOSIS — Z0000 Encounter for general adult medical examination without abnormal findings: Secondary | ICD-10-CM

## 2020-07-19 DIAGNOSIS — Z23 Encounter for immunization: Secondary | ICD-10-CM

## 2020-07-19 DIAGNOSIS — I340 Nonrheumatic mitral (valve) insufficiency: Secondary | ICD-10-CM

## 2020-07-19 DIAGNOSIS — Z13220 Encounter for screening for lipoid disorders: Secondary | ICD-10-CM | POA: Diagnosis not present

## 2020-07-19 LAB — URINALYSIS
Bilirubin, UA: NEGATIVE
Glucose, UA: NEGATIVE
Ketones, UA: NEGATIVE
Leukocytes,UA: NEGATIVE
Nitrite, UA: NEGATIVE
Protein,UA: NEGATIVE
RBC, UA: NEGATIVE
Specific Gravity, UA: 1.009 (ref 1.005–1.030)
Urobilinogen, Ur: 0.2 mg/dL (ref 0.2–1.0)
pH, UA: 6.5 (ref 5.0–7.5)

## 2020-07-19 NOTE — Progress Notes (Signed)
Established Patient Office Visit  Subjective:  Patient ID: Kevin Medina, male    DOB: 09/15/1970  Age: 50 y.o. MRN: [id number]  CC:  Chief Complaint  Patient presents with  . Annual Exam    Patient states he is here for an CPE and a shingles vaccine.    HPI [name] presents for CPE  Histories reviewed and updated Reviewed his course of treatment with cardiology No ongoing symptoms or concerns. Back to baseline activity level.  Interested in starting shingles vaccine course.   Due for colonoscopy. Hx of polyps. No famhx of colon ca.  Will draw routine labs today  Past Medical History:  Diagnosis Date  . Allergy   . Arrhythmia   . Exercise-induced asthma   . Heart murmur    due to MVP   . Hyperlipidemia   . Mitral regurgitation   . Mitral valve prolapse     Past Surgical History:  Procedure Laterality Date  . COLONOSCOPY    . POLYPECTOMY  15 yrs ago    ? type of polyps per pt   . RIGHT HEART CATH N/A 02/02/2020   Procedure: RIGHT HEART CATH;  Surgeon: Jolaine Artist, MD;  Location: [facility];  Service: Cardiovascular;  Laterality: N/A;  . RIGHT/LEFT HEART CATH AND CORONARY ANGIOGRAPHY N/A 10/05/2019   Procedure: RIGHT/LEFT HEART CATH AND CORONARY ANGIOGRAPHY;  Surgeon: Lorretta Harp, MD;  Location: [facility];  Service: Cardiovascular;  Laterality: N/A;  . TEE WITHOUT CARDIOVERSION N/A 10/05/2019   Procedure: TRANSESOPHAGEAL ECHOCARDIOGRAM (TEE);  Surgeon: Lelon Perla, MD;  Location: [facility];  Service: Cardiovascular;  Laterality: N/A;  . TEE WITHOUT CARDIOVERSION N/A 11/22/2019   Procedure: TRANSESOPHAGEAL ECHOCARDIOGRAM (TEE);  Surgeon: Rexene Alberts, MD;  Location: [facility];  Service: Open Heart Surgery;  Laterality: N/A;  . VASECTOMY      Family History  Problem Relation Age of Onset  . Healthy Mother   . Breast cancer Mother   . Hyperlipidemia Father   . Hypertension Father   . Healthy Sister   . Lung  cancer Maternal Grandmother        nonsmoker  . Stroke Maternal Grandfather   . Hyperlipidemia Paternal Grandfather   . Colon polyps Maternal Uncle   . Colon cancer Neg Hx   . Esophageal cancer Neg Hx   . Rectal cancer Neg Hx   . Stomach cancer Neg Hx     Social History   Socioeconomic History  . Marital status: Married    Spouse name: Not on file  . Number of children: 1  . Years of education: Not on file  . Highest education level: Not on file  Occupational History  . Not on file  Tobacco Use  . Smoking status: Former Smoker    Packs/day: 2.00    Years: 16.00    Pack years: 32.00    Types: Cigarettes    Quit date: 06/15/1997    Years since quitting: 23.1  . Smokeless tobacco: Former Network engineer  . Vaping Use: Never used  Substance and Sexual Activity  . Alcohol use: Yes    Alcohol/week: 0.0 - 1.0 standard drinks    Comment: social- none since jan 08-2019  . Drug use: Never  . Sexual activity: Yes  Other Topics Concern  . Not on file  Social History Narrative  . Not on file   Social Determinants of Health   Financial Resource Strain: Not on  file  Food Insecurity: Not on file  Transportation Needs: Not on file  Physical Activity: Not on file  Stress: Not on file  Social Connections: Not on file  Intimate Partner Violence: Not on file    Outpatient Medications Prior to Visit  Medication Sig Dispense Refill  . fish oil-omega-3 fatty acids 1000 MG capsule Take 2,000 mg by mouth daily.  (Patient not taking: Reported on 07/19/2020)    . Multiple Vitamin (MULTIVITAMIN) tablet Take 1 tablet by mouth daily. (Patient not taking: Reported on 07/19/2020)    . psyllium (METAMUCIL) 58.6 % packet Take 1 packet by mouth daily. (Patient not taking: Reported on 07/19/2020)    . Turmeric 500 MG CAPS Take 1,000 mg by mouth in the morning and at bedtime.  (Patient not taking: Reported on 07/19/2020)    . Digestive Enzymes TABS Take 1 tablet by mouth daily.       Facility-Administered Medications Prior to Visit  Medication Dose Route Frequency Provider Last Rate Last Admin  . sodium chloride flush (NS) 0.9 % injection 3 mL  3 mL Intravenous Q12H Lorretta Harp, MD        Allergies  Allergen Reactions  . Tape Itching and Rash    ROS Review of Systems  Constitutional: Negative.   HENT: Negative.   Eyes: Negative.   Respiratory: Negative.   Cardiovascular: Negative.   Gastrointestinal: Negative.   Genitourinary: Negative.   Musculoskeletal: Negative.   Skin: Negative.   Neurological: Negative.   Psychiatric/Behavioral: Negative.   All other systems reviewed and are negative.     Objective:    Physical Exam Vitals and nursing note reviewed.  Constitutional:      General: He is not in acute distress.    Appearance: Normal appearance. He is normal weight. He is not ill-appearing, toxic-appearing or diaphoretic.  HENT:     Head: Normocephalic and atraumatic.     Right Ear: Tympanic membrane, ear canal and external ear normal. There is no impacted cerumen.     Left Ear: Tympanic membrane, ear canal and external ear normal. There is no impacted cerumen.     Nose: Nose normal. No congestion or rhinorrhea.     Mouth/Throat:     Mouth: Mucous membranes are moist.     Pharynx: Oropharynx is clear. No oropharyngeal exudate or posterior oropharyngeal erythema.  Eyes:     General: No scleral icterus.       Right eye: No discharge.        Left eye: No discharge.     Extraocular Movements: Extraocular movements intact.     Conjunctiva/sclera: Conjunctivae normal.     Pupils: Pupils are equal, round, and reactive to light.  Neck:     Vascular: No carotid bruit.  Cardiovascular:     Rate and Rhythm: Normal rate and regular rhythm.     Pulses: Normal pulses.     Heart sounds: Murmur (as expected) heard.  No friction rub. No gallop.   Pulmonary:     Effort: Pulmonary effort is normal. No respiratory distress.     Breath sounds:  Normal breath sounds. No stridor. No wheezing, rhonchi or rales.  Chest:     Chest wall: No tenderness.  Abdominal:     General: Abdomen is flat. Bowel sounds are normal. There is no distension.     Palpations: Abdomen is soft. There is no mass.     Tenderness: There is no abdominal tenderness. There is no right CVA tenderness, left CVA  tenderness, guarding or rebound.     Hernia: No hernia is present.  Musculoskeletal:        General: No swelling, tenderness, deformity or signs of injury. Normal range of motion.     Cervical back: Normal range of motion and neck supple. No rigidity or tenderness.     Right lower leg: No edema.     Left lower leg: No edema.  Lymphadenopathy:     Cervical: No cervical adenopathy.  Skin:    General: Skin is warm and dry.     Capillary Refill: Capillary refill takes less than 2 seconds.     Coloration: Skin is not jaundiced or pale.     Findings: No bruising, erythema, lesion or rash.  Neurological:     General: No focal deficit present.     Mental Status: He is alert and oriented to person, place, and time. Mental status is at baseline.     Cranial Nerves: No cranial nerve deficit.     Motor: No weakness.     Gait: Gait normal.  Psychiatric:        Mood and Affect: Mood normal.        Behavior: Behavior normal.        Thought Content: Thought content normal.        Judgment: Judgment normal.     BP 138/70   Pulse 73   Temp 98 F (36.7 C) (Temporal)   Resp 18   Ht 5\' 5"  (1.651 m)   Wt 169 lb 12.8 oz (77 kg)   SpO2 99%   BMI 28.26 kg/m  Wt Readings from Last 3 Encounters:  07/19/20 169 lb 12.8 oz (77 kg)  02/02/20 167 lb (75.8 kg)  01/30/20 172 lb 6.4 oz (78.2 kg)     Health Maintenance Due  Topic Date Due  . COLONOSCOPY (Pts 45-73yrs Insurance coverage will need to be confirmed)  Never done    There are no preventive care reminders to display for this patient.  Lab Results  Component Value Date   TSH 3.500 07/14/2019   Lab  Results  Component Value Date   WBC 4.8 02/02/2020   HGB 15.3 02/02/2020   HGB 15.0 02/02/2020   HCT 45.0 02/02/2020   HCT 44.0 02/02/2020   MCV 92.3 02/02/2020   PLT 234 02/02/2020   Lab Results  Component Value Date   NA 149 (H) 02/02/2020   NA 146 (H) 02/02/2020   K 4.8 02/02/2020   K 4.9 02/02/2020   CO2 28 02/02/2020   GLUCOSE 97 02/02/2020   BUN 13 02/02/2020   CREATININE 1.24 02/02/2020   BILITOT 1.3 (H) 11/20/2019   ALKPHOS 53 11/20/2019   AST 27 11/20/2019   ALT 40 11/20/2019   PROT 6.5 11/20/2019   ALBUMIN 4.3 11/20/2019   CALCIUM 9.6 02/02/2020   ANIONGAP 8 02/02/2020   Lab Results  Component Value Date   CHOL 245 (H) 07/14/2019   Lab Results  Component Value Date   HDL 65 07/14/2019   Lab Results  Component Value Date   LDLCALC 170 (H) 07/14/2019   Lab Results  Component Value Date   TRIG 60 07/14/2019   Lab Results  Component Value Date   CHOLHDL 3.8 07/14/2019   Lab Results  Component Value Date   HGBA1C 5.5 11/20/2019      Assessment & Plan:   Problem List Items Addressed This Visit      Cardiovascular and Mediastinum   Mitral valve prolapse  Mitral regurgitation    Other Visit Diagnoses    Annual physical exam    -  Primary   Need for shingles vaccine       Relevant Orders   Varicella-zoster vaccine IM (Completed)   Screening for endocrine, metabolic and immunity disorder       Relevant Orders   CBC With Differential   Comprehensive metabolic panel   TSH   Hemoglobin A1c   Urinalysis   Lipid screening       Relevant Orders   Lipid panel      No orders of the defined types were placed in this encounter.   Follow-up: No follow-ups on file.   PLAN  Labs collected. Will follow up with the patient as warranted.  Murmur noted on exam as expected.  Otherwise exam unremarkable  Return in 2-6 mo for nurse visit for second shingles injection  Patient encouraged to call clinic with any questions, comments, or  concerns.  Maximiano Coss, NP

## 2020-07-19 NOTE — Patient Instructions (Signed)
° ° ° °  If you have lab work done today you will be contacted with your lab results within the next 2 weeks.  If you have not heard from us then please contact us. The fastest way to get your results is to register for My Chart. ° ° °IF you received an x-ray today, you will receive an invoice from [facility]. Please contact [facility] at [phone] with questions or concerns regarding your invoice.  ° °IF you received labwork today, you will receive an invoice from [facility]. Please contact [facility] at [phone] with questions or concerns regarding your invoice.  ° °Our billing staff will not be able to assist you with questions regarding bills from these companies. ° °You will be contacted with the lab results as soon as they are available. The fastest way to get your results is to activate your My Chart account. Instructions are located on the last page of this paperwork. If you have not heard from us regarding the results in 2 weeks, please contact this office. °  ° ° ° °

## 2020-07-20 LAB — CBC WITH DIFFERENTIAL
Basophils Absolute: 0 10*3/uL (ref 0.0–0.2)
Basos: 1 %
EOS (ABSOLUTE): 0 10*3/uL (ref 0.0–0.4)
Eos: 0 %
Hematocrit: 40.6 % (ref 37.5–51.0)
Hemoglobin: 13.9 g/dL (ref 13.0–17.7)
Immature Grans (Abs): 0 10*3/uL (ref 0.0–0.1)
Immature Granulocytes: 0 %
Lymphocytes Absolute: 1.2 10*3/uL (ref 0.7–3.1)
Lymphs: 19 %
MCH: 31.2 pg (ref 26.6–33.0)
MCHC: 34.2 g/dL (ref 31.5–35.7)
MCV: 91 fL (ref 79–97)
Monocytes Absolute: 0.5 10*3/uL (ref 0.1–0.9)
Monocytes: 7 %
Neutrophils Absolute: 4.6 10*3/uL (ref 1.4–7.0)
Neutrophils: 73 %
RBC: 4.45 x10E6/uL (ref 4.14–5.80)
RDW: 12.7 % (ref 11.6–15.4)
WBC: 6.4 10*3/uL (ref 3.4–10.8)

## 2020-07-20 LAB — COMPREHENSIVE METABOLIC PANEL
ALT: 51 IU/L — ABNORMAL HIGH (ref 0–44)
AST: 36 IU/L (ref 0–40)
Albumin/Globulin Ratio: 2.5 — ABNORMAL HIGH (ref 1.2–2.2)
Albumin: 4.8 g/dL (ref 3.8–4.9)
Alkaline Phosphatase: 64 IU/L (ref 44–121)
BUN/Creatinine Ratio: 9 (ref 9–20)
BUN: 13 mg/dL (ref 6–24)
Bilirubin Total: 0.5 mg/dL (ref 0.0–1.2)
CO2: 25 mmol/L (ref 20–29)
Calcium: 9.6 mg/dL (ref 8.7–10.2)
Chloride: 101 mmol/L (ref 96–106)
Creatinine, Ser: 1.43 mg/dL — ABNORMAL HIGH (ref 0.76–1.27)
GFR calc Af Amer: 65 mL/min/{1.73_m2} (ref >59)
GFR calc non Af Amer: 56 mL/min/{1.73_m2} — ABNORMAL LOW (ref >59)
Globulin, Total: 1.9 g/dL (ref 1.5–4.5)
Glucose: 89 mg/dL (ref 65–99)
Potassium: 4.8 mmol/L (ref 3.5–5.2)
Sodium: 141 mmol/L (ref 134–144)
Total Protein: 6.7 g/dL (ref 6.0–8.5)

## 2020-07-20 LAB — LIPID PANEL
Chol/HDL Ratio: 4.4 ratio (ref 0.0–5.0)
Cholesterol, Total: 265 mg/dL — ABNORMAL HIGH (ref 100–199)
HDL: 60 mg/dL (ref >39)
LDL Chol Calc (NIH): 194 mg/dL — ABNORMAL HIGH (ref 0–99)
Triglycerides: 70 mg/dL (ref 0–149)
VLDL Cholesterol Cal: 11 mg/dL (ref 5–40)

## 2020-07-20 LAB — HEMOGLOBIN A1C
Est. average glucose Bld gHb Est-mCnc: 108 mg/dL
Hgb A1c MFr Bld: 5.4 % (ref 4.8–5.6)

## 2020-07-20 LAB — TSH: TSH: 3.01 u[IU]/mL (ref 0.450–4.500)

## 2020-07-23 ENCOUNTER — Encounter: Payer: Self-pay | Admitting: Registered Nurse

## 2020-09-11 ENCOUNTER — Telehealth: Payer: Self-pay

## 2020-09-11 DIAGNOSIS — Z1211 Encounter for screening for malignant neoplasm of colon: Secondary | ICD-10-CM

## 2020-09-11 NOTE — Telephone Encounter (Signed)
Pt due for colonoscopy per last OV note from R. Morrow. Ordered to be completed

## 2020-11-26 ENCOUNTER — Encounter: Payer: Self-pay | Admitting: Registered Nurse

## 2020-12-13 ENCOUNTER — Telehealth: Payer: Self-pay | Admitting: Internal Medicine

## 2020-12-13 ENCOUNTER — Telehealth (HOSPITAL_COMMUNITY): Payer: Self-pay | Admitting: *Deleted

## 2020-12-13 NOTE — Telephone Encounter (Signed)
Lance Bosch into [employer] today. He says that past month some increased dyspne and less ET at cross fit. Told me Dr Roxy Manns relocating to [facility]. Wants your in put/see you back/get repeat echo. Said I will passt it on to you  Thanks  MR

## 2020-12-13 NOTE — Telephone Encounter (Signed)
Pt called requesting an appt with Dr.Bensimhon for palp and sob that comes and goes. Next available is October.   Routed to [employer] for advice

## 2020-12-17 NOTE — Telephone Encounter (Signed)
Dr Haroldine Laws is on vacation, will route to [facility], NP to see if she would like to order a repeat Zio or see pt in clinic this week.

## 2020-12-18 NOTE — Telephone Encounter (Signed)
Left vm requesting return call.  

## 2020-12-19 NOTE — Telephone Encounter (Signed)
Pt would rather see provider before tests/procedures are ordered   Appt 7/22 @ 130

## 2020-12-19 NOTE — Telephone Encounter (Signed)
See encounter from 7/5, appt sch 7/22

## 2021-01-03 ENCOUNTER — Other Ambulatory Visit (HOSPITAL_COMMUNITY): Payer: Self-pay | Admitting: Internal Medicine

## 2021-01-03 ENCOUNTER — Other Ambulatory Visit: Payer: Self-pay

## 2021-01-03 ENCOUNTER — Ambulatory Visit (HOSPITAL_COMMUNITY)
Admission: RE | Admit: 2021-01-03 | Discharge: 2021-01-03 | Disposition: A | Discharge status: Home / Self Care | Payer: BC Managed Care – PPO | Source: Ambulatory Visit | Attending: Internal Medicine | Admitting: Internal Medicine

## 2021-01-03 ENCOUNTER — Encounter: Payer: Self-pay | Admitting: Gastroenterology

## 2021-01-03 ENCOUNTER — Encounter (HOSPITAL_COMMUNITY): Payer: Self-pay

## 2021-01-03 ENCOUNTER — Ambulatory Visit (HOSPITAL_COMMUNITY)
Admission: RE | Admit: 2021-01-03 | Discharge: 2021-01-03 | Disposition: A | Discharge status: Home / Self Care | Payer: BC Managed Care – PPO | Source: Ambulatory Visit | Attending: Family Medicine | Admitting: Family Medicine

## 2021-01-03 VITALS — BP 134/78 | HR 67 | Wt 168.8 lb

## 2021-01-03 DIAGNOSIS — Z87891 Personal history of nicotine dependence: Secondary | ICD-10-CM | POA: Diagnosis not present

## 2021-01-03 DIAGNOSIS — I340 Nonrheumatic mitral (valve) insufficiency: Secondary | ICD-10-CM | POA: Diagnosis not present

## 2021-01-03 DIAGNOSIS — R5383 Other fatigue: Secondary | ICD-10-CM | POA: Insufficient documentation

## 2021-01-03 DIAGNOSIS — Z09 Encounter for follow-up examination after completed treatment for conditions other than malignant neoplasm: Secondary | ICD-10-CM | POA: Insufficient documentation

## 2021-01-03 DIAGNOSIS — R0602 Shortness of breath: Secondary | ICD-10-CM | POA: Insufficient documentation

## 2021-01-03 DIAGNOSIS — Z8249 Family history of ischemic heart disease and other diseases of the circulatory system: Secondary | ICD-10-CM | POA: Insufficient documentation

## 2021-01-03 DIAGNOSIS — R42 Dizziness and giddiness: Secondary | ICD-10-CM | POA: Insufficient documentation

## 2021-01-03 DIAGNOSIS — R002 Palpitations: Secondary | ICD-10-CM | POA: Diagnosis not present

## 2021-01-03 DIAGNOSIS — I341 Nonrheumatic mitral (valve) prolapse: Secondary | ICD-10-CM | POA: Insufficient documentation

## 2021-01-03 NOTE — Progress Notes (Signed)
ADVANCED HF CLINIC NOTE  PCP: Maximiano Coss, NP Primary Cardiologist: Gwenlyn Found HF Cardiologist: Dr. Haroldine Laws  HPI:  Kevin Medina is a 50 year old male with HL, mitral valve prolapse and mitral regurgitation.   He was first noted to have mitral valve prolapse approximately 15 years ago when he underwent an echocardiogram as part of the work-up for symptoms of palpitations.  He was told at the time that the valve is not leaking much and he did not need surgical intervention but that eventually it might progress. Developed COVID-19 infection in October 2020.  After his recovery he experienced significant exertional shortness of breath with initial attempt to return to exercising on a regular basis, and he states that it took approximately a month for him to get back to his baseline exercise tolerance.  Over the past 6 months he has noticed more more symptoms of exertional shortness of breath and occasional tachypalpitations.  He exercises religiously 6 days a week including strenuous high intensity training and running.  He has noticed a significant change in his exercise tolerance and he occasionally gets palpitations that are usually transient and without associated dizziness.  He was seen in follow-up by Maximiano Coss, NP who noticed a prominent systolic murmur on exam and referred the patient to Dr. Quay Burow for formal cardiology consultation.  Transthoracic echocardiogram revealed bileaflet mitral valve prolapse with at least moderate mitral regurgitation and abnormal left ventricular systolic function, ejection fraction estimated 40%.  TEE confirmed the presence of bileaflet prolapse with "moderate to severe" mitral regurgitation.  Left ventricular ejection fraction was estimated 40 to 45%.  There was normal right ventricular size and function.  There was mild left atrial enlargement.  Left and right heart catheterization revealed normal coronary artery anatomy with no significant coronary artery  disease and normal right heart pressures.    He was seen by Dr. Roxy Manns and scheduled for MVR on 6//9/21. However initial intra-op TEE showed minimal MR so surgery was cancelled. Subsequently had CPX test and stress echo. CPX showed excellent functional capacity with mildly elevated VEVCO2 suggestive of increased pulmonary pressures with exercise. Stress echo with mod MR at rest and exercise.   He was seen in clinic 7/21. Crossfit 3x/week, weights 1x/week. Run 2x/week for 5k, pace is about 30-60 seconds per mile slower than before. Occasionally has problems walking up steps. Often has heart pounding prominently and feels it is irregular sometimes. No edema, orthopnea or PND. Has had occasional very heavy workouts where he can taste blood but no frank hemoptysis.   He underwent exercise RHC (see below) and Zio placed to quantify palpitations, no high-grade arrhythmias.  Today he returns for HF follow up. Was in [place] 8/21 and was SOB (higher elevation), took ASA and felt better. Around 5/22, felt increasing palpitations and SOB intermittently. At rest and during exercise. Struggling with fatigue. SOB with stairs. Now running 12.5 min/mile, previously running 9 minute mile. Having some dizziness with position changes. Feels as if he cannot take a full breath. Sleeping poorly. Denies CP, dizziness, edema, or PND/Orthopnea. Weight stable.  Cardiac Studies: - RHC (4/21):  RA = 2 RV = 19/3 PA = 10/8 (6) PCW = 5 (v = 6) Fick cardiac output/index =5.9/3.  - Resting echo (6/21): EF 40-45% with moderate to severe MR but when sedated/unloaded MR seems much less severe on cath and intra-op TEE  - Stress echo (7/21): with moderate MR at baseline. Mildly worse with exercise. Excellent exercise capacity - the patient achieved 17  METS. Normal RVSP at rest and on peak exertion, The baseline PASP was estimated to be 13.6 mmHg. The PASP increased to 20.8 mmHg at peak stress. This is a low risk study.  - CPX  (6/21):  FVC 4.64 (111%)      FEV1 3.52 (105%)        FEV1/FVC 76 (94%)        MVV 185 (136%)       Resting HR: 68 Standing HR: 71 Peak HR: 169   (99% age predicted max HR)  BP rest: 136/78 Standing BP: 132/72 BP peak: 198/66  Peak VO2: 49.8 (152% predicted peak VO2)  VE/VCO2 slope:  35  OUES: 3.29  Peak RER: 1.05  Ventilatory threshold: 39.1 (120% predicted or 79% measured peak VO2)  VE/MVV:  77%  O2pulse:  23   (153% predicted O2pulse)   - Stress echo (12/14/19):    1. Moderate to severe mitral regurgitation at rest and on exertion.     2. Normal RVSP at rest and on peak exertion.     3. The baseline PASP was estimated to be 13.6 mmHg. The PASP increased to  20.8 mmHg at peak stress.   - Exercise RHC (8/21):  Resting RHC:   Ao = 158/84 (108) RA = 4 RV = 19/2 PA =  19/7 (12) PCW = 7 (no significant v-wave) Fick CO/CI = 4.9/2.7 Thermo cardiac output/index = 5.4/2.9 PVR = 1.0 WU Ao sat = 99% PA sat = 75%   Unloaded cycling (0W) Ao = 185/82 (116) PA = 31/10 (22) PCW = 13 (v =18) Thermo CO/CI = 11.9/6.5   Stage 1 (40W) Ao = 192/81 (118) PA = 33/12 (23) PCW = 14 (v =12) Thermo CO/CI = 12.1/6.5 PVR = 0.7 WU Ao sat = 98% PA sat = 98%   Stage 2 (65W) Ao = 207/79 (121) PA = 40/12 (25) PCW = 18 (v = 24) Thermo CO/CI = unobtainable   Stage 3 (90W) Ao = 214/77 (122) PA = 38/5 (22) PCW = 19 (v = 26) Thermo CO/CI = unobtainable   Stage 4 (115W) Ao = 232/83 (128) PA = 36/5 (24) PCW = 16 (v = 20) Thermo CO/CI = unobtainable Ao sat 98% PA sat 32%   Stage 5 (130W) Ao = 218/83 (127) PA = 42/4 (26) PCW = 12 (v = 22) Thermo CO/CI = unobtainable   Early recovery (easy cycling) PCW = 17 (v = 23)   Assessment:   Normal resting hemodynamics. Hypertensive responsive to exercise. Mild increase in PA pressures and PCWP/v-waves with exercise. Would continue watchful waiting of his MR.  - Zio (8/21):  1. Sinus rhythm - mean HR of 73 2. Three runs of SVT -  the  longest lasting 8 beats with an avg rate of 96 bpm. 3. Frequent PACs (15.0%,217975) 4. Rare PVCs 5. Multiple patient-triggered events typically associated with isolated PACs or sinus rhythm 6. No high-grade arrhythmias  ROS: All systems reviewed and negative except as per HPI.   Past Medical History:  Diagnosis Date   Allergy    Arrhythmia    Exercise-induced asthma    Heart murmur    due to MVP    Hyperlipidemia    Mitral regurgitation    Mitral valve prolapse    Current Outpatient Medications  Medication Sig Dispense Refill   fish oil-omega-3 fatty acids 1000 MG capsule Take 2,000 mg by mouth daily.     Multiple Vitamin (MULTIVITAMIN) tablet Take 1 tablet  by mouth daily.     psyllium (METAMUCIL) 58.6 % packet Take 1 packet by mouth daily.     Turmeric 500 MG CAPS Take 1,000 mg by mouth in the morning and at bedtime.     Current Facility-Administered Medications  Medication Dose Route Frequency Provider Last Rate Last Admin   sodium chloride flush (NS) 0.9 % injection 3 mL  3 mL Intravenous Q12H Lorretta Harp, MD       Allergies  Allergen Reactions   Tape Itching and Rash   Social History   Socioeconomic History   Marital status: Married    Spouse name: Not on file   Number of children: 1   Years of education: Not on file   Highest education level: Not on file  Occupational History   Not on file  Tobacco Use   Smoking status: Former    Packs/day: 2.00    Years: 16.00    Pack years: 32.00    Types: Cigarettes    Quit date: 06/15/1997    Years since quitting: 23.5   Smokeless tobacco: Former  Scientific laboratory technician Use: Never used  Substance and Sexual Activity   Alcohol use: Yes    Alcohol/week: 0.0 - 1.0 standard drinks    Comment: social- none since jan 08-2019   Drug use: Never   Sexual activity: Yes  Other Topics Concern   Not on file  Social History Narrative   Not on file   Social Determinants of Health   Financial Resource Strain: Not on file   Food Insecurity: Not on file  Transportation Needs: Not on file  Physical Activity: Not on file  Stress: Not on file  Social Connections: Not on file  Intimate Partner Violence: Not on file   Family History  Problem Relation Age of Onset   Healthy Mother    Breast cancer Mother    Hyperlipidemia Father    Hypertension Father    Healthy Sister    Lung cancer Maternal Grandmother        nonsmoker   Stroke Maternal Grandfather    Hyperlipidemia Paternal Grandfather    Colon polyps Maternal Uncle    Colon cancer Neg Hx    Esophageal cancer Neg Hx    Rectal cancer Neg Hx    Stomach cancer Neg Hx    BP 134/78   Pulse 67   Wt 76.6 kg (168 lb 12.8 oz)   SpO2 98%   BMI 28.09 kg/m   Wt Readings from Last 3 Encounters:  01/03/21 76.6 kg (168 lb 12.8 oz)  07/19/20 77 kg (169 lb 12.8 oz)  02/02/20 75.8 kg (167 lb)   PHYSICAL EXAM: General:  NAD. No resp difficulty HEENT: Normal Neck: Supple. No JVD. Carotids 2+ bilat; no bruits. No lymphadenopathy or thryomegaly appreciated. Cor: PMI nondisplaced. Regular rate & rhythm. No rubs, gallops, II/VI MR Lungs: Clear Abdomen: Soft, nontender, nondistended. No hepatosplenomegaly. No bruits or masses. Good bowel sounds. Extremities: No cyanosis, clubbing, rash, edema Neuro: Alert & oriented x 3, cranial nerves grossly intact. Moves all 4 extremities w/o difficulty. Affect pleasant.  ECG: SR w/ PVCs (personally reviewed).  ASSESSMENT & PLAN:  1) Mitral regurgitation  - Resting echo, TEE, stress echo and cath studies viewed by Dr. Haroldine Laws. - Due to Barlow's disease. - Resting echo (6/21): EF 40-45% with moderate to severe MR but when sedated/unloaded MR seems much less severe on cath and intra-op TEE - CPX (6/21): excellent functional capacity with mildly  elevated VE/VCO2 slope suggested of increased pulmonary pressures with exercise - Stress echo (7/21): with moderate MR at baseline. Mildly worse with exercise. - Dr. Haroldine Laws  discussed case with Dr. Roxy Manns and both agree that it is not a matter of 'if" but "when" he will need to have his valve fixed. That said, given his high functional capacity currently, even with moderate to severe MR it is unlikely that repair will improve his exercise tolerance and may even worsen it, so close watchful waiting may be best but will need to follow closely to avoid deterioration of LV function. - Exercise RHC (8/21): Normal resting hemodynamics. Hypertensive responsive to exercise. Mild increase in PA pressures and PCWP/v-waves with exercise. Would continue watchful waiting of his MR. - Will repeat echo & CPX. - Consider addition of losartan.  2) Palpitations - With his degree of MR and LAE, he is at high risk for AF and other atrial dysrhythmias. - Zio (8/22) no high-grade arrhythmias - Place Zio 14 day. - TSH (6/22) 2.88, fT4 1.52, fT3 3.1  [place], [place]  01/03/21  Patient seen and examined with the above-signed Advanced Practice Provider and/or Housestaff. I personally reviewed laboratory data, imaging studies and relevant notes. I independently examined the patient and formulated the important aspects of the plan. I have edited the note to reflect any of my changes or salient points. I have personally discussed the plan with the patient and/or family.  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 Has had extensive w/u in recent past and decision was to hold off on MV repair at that time.   Now returns with recurrent palpitations and worsening exercise tolerance. Had COVID earlier in the year and felt some symptoms related to prolonged COVID but then got better before getting worse again.   General:  Well appearing. No resp difficulty HEENT: normal Neck: supple. no JVD. Carotids 2+ bilat; no bruits. No lymphadenopathy or thryomegaly appreciated. Cor: PMI nondisplaced. Regular rate & rhythm. 2/6 late MR Lungs: clear Abdomen: soft, nontender,  nondistended. No hepatosplenomegaly. No bruits or masses. Good bowel sounds. Extremities: no cyanosis, clubbing, rash, edema Neuro: alert & orientedx3, cranial nerves grossly intact. moves all 4 extremities w/o difficulty. Affect pleasant  We discussed his situation at length as well as timing of possible MVR. On his exercise RHC last year, he had evidence of mild worsening of MR with exercise but it was not profound.   Will proceed with repeat CPX testing if VO2 and VEVCO2 worse is likely reaching the point where MVR is indicated (would send to [facility]). Will also place Zio to further assess palpitations.  Total time spent 40 minutes. Over half that time spent discussing above and reviewing previous studies.   Glori Bickers, MD  5:51 PM

## 2021-01-03 NOTE — Patient Instructions (Addendum)
EKG done today.  No Labs done today.   No medication changes were made. Please continue all current medications as prescribed.  Your provider has recommended that  you wear a Zio Patch for 14 days.  This monitor will record your heart rhythm for our review.  IF you have any symptoms while wearing the monitor please press the button.  If you have any issues with the patch or you notice a red or orange light on it please call the company at [phone].  Once you remove the patch please mail it back to the company as soon as possible so we can get the results.  Your physician recommends that you schedule a follow-up appointment soon for an echo and a CPX Test 6 weeks with Dr. Haroldine Laws  Your physician has recommended that you have a cardiopulmonary stress test (CPX). CPX testing is a non-invasive measurement of heart and lung function. It replaces a traditional treadmill stress test. This type of test provides a tremendous amount of information that relates not only to your present condition but also for future outcomes. This test combines measurements of you ventilation, respiratory gas exchange in the lungs, electrocardiogram (EKG), blood pressure and physical response before, during, and following an exercise protocol.  Your physician has requested that you have an echocardiogram. Echocardiography is a painless test that uses sound waves to create images of your heart. It provides your doctor with information about the size and shape of your heart and how well your heart's chambers and valves are working. This procedure takes approximately one hour. There are no restrictions for this procedure.  If you have any questions or concerns before your next appointment please send [place] a message through [place] or call our office at [phone].    TO LEAVE A MESSAGE FOR THE NURSE SELECT OPTION 2, PLEASE LEAVE A MESSAGE INCLUDING: YOUR NAME DATE OF BIRTH CALL BACK NUMBER REASON FOR CALL**this is  important as we prioritize the call backs  YOU WILL RECEIVE A CALL BACK THE SAME DAY AS LONG AS YOU CALL BEFORE 4:00 PM   Do the following things EVERYDAY: Weigh yourself in the morning before breakfast. Write it down and keep it in a log. Take your medicines as prescribed Eat low salt foods--Limit salt (sodium) to 2000 mg per day.  Stay as active as you can everyday Limit all fluids for the day to less than 2 liters   At the [facility], you and your health needs are our priority. As part of our continuing mission to provide you with exceptional heart care, we have created designated Provider Care Teams. These Care Teams include your primary Cardiologist (physician) and Advanced Practice Providers (APPs- Physician Assistants and Nurse Practitioners) who all work together to provide you with the care you need, when you need it.   You may see any of the following providers on your designated Care Team at your next follow up: Dr Glori Bickers Dr Haynes Kerns, NP Lyda Jester, [place] Audry Riles, PharmD   Please be sure to bring in all your medications bottles to every appointment.

## 2021-01-06 ENCOUNTER — Encounter (HOSPITAL_COMMUNITY): Payer: Self-pay

## 2021-01-20 DIAGNOSIS — R002 Palpitations: Secondary | ICD-10-CM | POA: Diagnosis not present

## 2021-01-21 ENCOUNTER — Telehealth: Payer: Self-pay

## 2021-01-21 NOTE — Telephone Encounter (Signed)
Good Day Dr. Loletha Carrow and Jenny Reichmann  This pt was reviewed in 07/2019 for cardiac status w/John approving for anesthesia, but provider wanting to wait until tests completed.  However, it looks like cardiac concerns continue or has changed.  Please review the cardiac note dated 7/22 and advise if pt can proceed with direct colon on 9/2 or needs to wait or get cardiac clearance.  Thank you for your review.

## 2021-01-22 NOTE — Telephone Encounter (Signed)
Spoke with pt-explaining that Dr. Havery Moros wants to waiting until all testing and evaluations is done with cardiac clearance prior to having procedure.  Pt voiced his understanding, and will call back when this has been done.

## 2021-01-22 NOTE — Telephone Encounter (Signed)
Attempted to reach pt to discuss with him Jenny Reichmann and Dr. Ozella Rocks wishes to delay colonoscopy until scheduled cardiac procedures are done and evaluated.Marland Kitchen  LVMM to call back to discuss these concerns.

## 2021-01-22 NOTE — Telephone Encounter (Signed)
Agree with Jenny Reichmann. Coralyn Mark can you please let this patient know that his cardiac workup needs to be done prior to proceeding with his procedure with [place]. He is scheduled for a colonoscopy with me in a few weeks. That may be cutting it close and may be better to book his procedure a bit further out in order to get his cardiac workup done, unless he has that scheduled for the very near future. Thanks

## 2021-01-22 NOTE — Addendum Note (Signed)
Encounter addended by: Micki Riley, RN on: 01/22/2021 9:35 AM  Actions taken: Imaging Exam ended

## 2021-01-27 NOTE — Addendum Note (Signed)
Encounter addended by: Stanford Scotland, RN on: 01/27/2021 1:51 PM  Actions taken: Imaging Exam begun

## 2021-01-30 ENCOUNTER — Ambulatory Visit (HOSPITAL_COMMUNITY): Payer: BC Managed Care – PPO

## 2021-01-30 ENCOUNTER — Encounter (HOSPITAL_COMMUNITY): Payer: BC Managed Care – PPO

## 2021-01-30 ENCOUNTER — Ambulatory Visit (HOSPITAL_COMMUNITY)
Admission: RE | Admit: 2021-01-30 | Discharge: 2021-01-30 | Disposition: A | Discharge status: Home / Self Care | Payer: BC Managed Care – PPO | Source: Ambulatory Visit | Attending: Family Medicine | Admitting: Family Medicine

## 2021-01-30 ENCOUNTER — Other Ambulatory Visit: Payer: Self-pay

## 2021-01-30 DIAGNOSIS — Z8616 Personal history of COVID-19: Secondary | ICD-10-CM | POA: Insufficient documentation

## 2021-01-30 DIAGNOSIS — I340 Nonrheumatic mitral (valve) insufficiency: Secondary | ICD-10-CM

## 2021-01-30 DIAGNOSIS — R002 Palpitations: Secondary | ICD-10-CM | POA: Insufficient documentation

## 2021-01-30 DIAGNOSIS — I350 Nonrheumatic aortic (valve) stenosis: Secondary | ICD-10-CM | POA: Diagnosis not present

## 2021-01-30 DIAGNOSIS — I341 Nonrheumatic mitral (valve) prolapse: Secondary | ICD-10-CM | POA: Diagnosis not present

## 2021-01-30 DIAGNOSIS — R0600 Dyspnea, unspecified: Secondary | ICD-10-CM | POA: Insufficient documentation

## 2021-01-30 DIAGNOSIS — I509 Heart failure, unspecified: Secondary | ICD-10-CM | POA: Insufficient documentation

## 2021-01-30 LAB — ECHOCARDIOGRAM COMPLETE
Area-P 1/2: 2.81 cm2
S' Lateral: 2.5 cm

## 2021-01-30 NOTE — Progress Notes (Signed)
  Echocardiogram 2D Echocardiogram has been performed.  Matilde Bash 01/30/2021, 3:05 PM

## 2021-01-31 ENCOUNTER — Encounter (HOSPITAL_COMMUNITY): Payer: Self-pay

## 2021-02-03 ENCOUNTER — Encounter (HOSPITAL_COMMUNITY): Payer: Self-pay | Admitting: Cardiology

## 2021-02-14 ENCOUNTER — Encounter: Payer: BC Managed Care – PPO | Admitting: Gastroenterology

## 2021-02-21 ENCOUNTER — Ambulatory Visit (HOSPITAL_COMMUNITY)
Admission: RE | Admit: 2021-02-21 | Discharge: 2021-02-21 | Disposition: A | Discharge status: Home / Self Care | Payer: BC Managed Care – PPO | Source: Ambulatory Visit | Attending: Internal Medicine | Admitting: Internal Medicine

## 2021-02-21 ENCOUNTER — Other Ambulatory Visit: Payer: Self-pay

## 2021-02-21 VITALS — BP 150/82 | HR 68 | Ht 65.0 in | Wt 166.0 lb

## 2021-02-21 DIAGNOSIS — I341 Nonrheumatic mitral (valve) prolapse: Secondary | ICD-10-CM | POA: Insufficient documentation

## 2021-02-21 DIAGNOSIS — R002 Palpitations: Secondary | ICD-10-CM | POA: Diagnosis not present

## 2021-02-21 DIAGNOSIS — I340 Nonrheumatic mitral (valve) insufficiency: Secondary | ICD-10-CM | POA: Diagnosis not present

## 2021-02-21 DIAGNOSIS — Z8249 Family history of ischemic heart disease and other diseases of the circulatory system: Secondary | ICD-10-CM | POA: Diagnosis not present

## 2021-02-21 DIAGNOSIS — Z8616 Personal history of COVID-19: Secondary | ICD-10-CM | POA: Diagnosis not present

## 2021-02-21 DIAGNOSIS — R0683 Snoring: Secondary | ICD-10-CM | POA: Insufficient documentation

## 2021-02-21 DIAGNOSIS — Z888 Allergy status to other drugs, medicaments and biological substances status: Secondary | ICD-10-CM | POA: Diagnosis not present

## 2021-02-21 DIAGNOSIS — Z79899 Other long term (current) drug therapy: Secondary | ICD-10-CM | POA: Insufficient documentation

## 2021-02-21 DIAGNOSIS — R5383 Other fatigue: Secondary | ICD-10-CM | POA: Diagnosis not present

## 2021-02-21 DIAGNOSIS — I471 Supraventricular tachycardia: Secondary | ICD-10-CM | POA: Diagnosis not present

## 2021-02-21 DIAGNOSIS — E785 Hyperlipidemia, unspecified: Secondary | ICD-10-CM | POA: Insufficient documentation

## 2021-02-21 DIAGNOSIS — Z87891 Personal history of nicotine dependence: Secondary | ICD-10-CM | POA: Insufficient documentation

## 2021-02-21 NOTE — Progress Notes (Signed)
Insurance not covering Itamar sleep study.Pt questioning out of pocket cost of study,price given to patient. Will discuss with wife and call back clinic.

## 2021-02-21 NOTE — Progress Notes (Signed)
ADVANCED HF CLINIC NOTE  PCP: Maximiano Coss, NP Primary Cardiologist: Gwenlyn Found HF Cardiologist: Dr. Haroldine Laws  HPI:  Kevin Medina is a 50 year old male with HL, mitral valve prolapse and mitral regurgitation.   First noted to have MVP ~15 years ago when he underwent an echo due to palpitations. He was told at the time that the valve is not leaking much and he did not need surgical intervention. Developed COVID-19 infection in October 2020.  After his recovery he experienced significant exertional shortness of breath with initial attempt to return to exercise. Echo in 2021 EF 40%  with bileaflet MVP with at least moderate MR and EF 40%  TEE confirmed the presence of bileaflet prolapse with "moderate to severe" MR.   LVEF 40 to 45%. RV normal. L/R cath -> No CAD and normal right heart pressures.    Seen by Dr. Roxy Manns and scheduled for MVR on 11/22/19. However initial intra-op TEE showed minimal MR so surgery was cancelled. Subsequently had CPX test and stress echo. CPX showed excellent functional capacity with mildly elevated VEVCO2 suggestive of increased pulmonary pressures with exercise. Stress echo with mod MR at rest and exercise. Subsequently underwent exercise RHC (as below) and MVR deferred due to only mild increase in PA pressures with exercise.   Seen back in Clinic 7/22 with recurrent DOE and palpitations. Echo 8/22 EF 60-65% Barlow's type valve mild to mod MVR. LA dilated. RV ok.  Zio (7/22) with occasional SVT. Repeat CPX with excellent functional capacity but Ve/VCO2 slope 41 suggestive of significant increase in pulmonary pressures/dead space with exercise.   Says he feels very fatigued but still exercising regularly. Snores at times and wakes himself up. Notes HR rises rapidly with exercise. Still with some palpitations.    FVC 4.89 (117%)      FEV1 3.67 (111%)        FEV1/FVC 75 (94%)        MVV 180 (133%)   Resting HR: 77 Standing HR: 80 Peak HR: 163   (96% age predicted max HR)  BP  rest: 130/68 Standing BP: 116/68 BP peak: 184/62  Peak VO2: 47.8 (136% predicted peak VO2)  VE/VCO2 slope:  41  OUES: 3.31  Peak RER: 1.00  Ventilatory Threshold: 33.2 (95% predicted or 69% measured peak VO2)  Peak RR 64  Peak Ventilation:  155.4  VE/MVV:  86%  PETCO2 at peak:  26  O2pulse:  22   (137% predicted O2pulse)     Cardiac Studies:  - Resting echo (6/21): EF 40-45% with moderate to severe MR but when sedated/unloaded MR seems much less severe on cath and intra-op TEE  - Stress echo (7/21): with moderate MR at baseline. Mildly worse with exercise. Excellent exercise capacity - the patient achieved 17 METS. Normal RVSP at rest and on peak exertion, The baseline PASP was estimated to be 13.6 mmHg. The PASP increased to 20.8 mmHg at peak stress. This is a low risk study.  - CPX (6/21):  FVC 4.64 (111%)      FEV1 3.52 (105%)        FEV1/FVC 76 (94%)        MVV 185 (136%)       Resting HR: 68 Standing HR: 71 Peak HR: 169   (99% age predicted max HR)  BP rest: 136/78 Standing BP: 132/72 BP peak: 198/66  Peak VO2: 49.8 (152% predicted peak VO2)  VE/VCO2 slope:  35  OUES: 3.29  Peak RER: 1.05  Ventilatory threshold: 39.1 (120%  predicted or 79% measured peak VO2)  VE/MVV:  77%  O2pulse:  23   (153% predicted O2pulse)   - Stress echo (12/14/19):    1. Moderate to severe mitral regurgitation at rest and on exertion.     2. Normal RVSP at rest and on peak exertion.     3. The baseline PASP was estimated to be 13.6 mmHg. The PASP increased to  20.8 mmHg at peak stress.   Exercise RHC (8/21):    Ao = 158/84 (108) RA = 4 RV = 19/2 PA =  19/7 (12) PCW = 7 (no significant v-wave) Fick CO/CI = 4.9/2.7 Thermo cardiac output/index = 5.4/2.9 PVR = 1.0 WU Ao sat = 99% PA sat = 75%   Unloaded cycling (0W) Ao = 185/82 (116) PA = 31/10 (22) PCW = 13 (v =18) Thermo CO/CI = 11.9/6.5   Stage 1 (40W) Ao = 192/81 (118) PA = 33/12 (23) PCW = 14 (v =12) Thermo CO/CI =  12.1/6.5 PVR = 0.7 WU Ao sat = 98% PA sat = 98%   Stage 2 (65W) Ao = 207/79 (121) PA = 40/12 (25) PCW = 18 (v = 24) Thermo CO/CI = unobtainable   Stage 3 (90W) Ao = 214/77 (122) PA = 38/5 (22) PCW = 19 (v = 26) Thermo CO/CI = unobtainable   Stage 4 (115W) Ao = 232/83 (128) PA = 36/5 (24) PCW = 16 (v = 20) Thermo CO/CI = unobtainable Ao sat 98% PA sat 32%   Stage 5 (130W) Ao = 218/83 (127) PA = 42/4 (26) PCW = 12 (v = 22) Thermo CO/CI = unobtainable   Early recovery (easy cycling) PCW = 17 (v = 23)   Assessment:   Normal resting hemodynamics. Hypertensive responsive to exercise. Mild increase in PA pressures and PCWP/v-waves with exercise. Would continue watchful waiting of his MR.  ROS: All systems reviewed and negative except as per HPI.   Past Medical History:  Diagnosis Date   Allergy    Arrhythmia    Exercise-induced asthma    Heart murmur    due to MVP    Hyperlipidemia    Mitral regurgitation    Mitral valve prolapse    Current Outpatient Medications  Medication Sig Dispense Refill   fish oil-omega-3 fatty acids 1000 MG capsule Take 4,500 mg by mouth daily. Sometimes takes more than '4500mg'$  daily     Multiple Vitamin (MULTIVITAMIN) tablet Take 1 tablet by mouth daily.     psyllium (METAMUCIL) 58.6 % packet Take 1 packet by mouth daily.     Turmeric 500 MG CAPS Take 1,000 mg by mouth at bedtime.     Current Facility-Administered Medications  Medication Dose Route Frequency Provider Last Rate Last Admin   sodium chloride flush (NS) 0.9 % injection 3 mL  3 mL Intravenous Q12H Lorretta Harp, MD       Allergies  Allergen Reactions   Tape Itching and Rash   Social History   Socioeconomic History   Marital status: Married    Spouse name: Not on file   Number of children: 1   Years of education: Not on file   Highest education level: Not on file  Occupational History   Not on file  Tobacco Use   Smoking status: Former    Packs/day: 2.00     Years: 16.00    Pack years: 32.00    Types: Cigarettes    Quit date: 06/15/1997    Years since quitting:  23.7   Smokeless tobacco: Former  Scientific laboratory technician Use: Never used  Substance and Sexual Activity   Alcohol use: Yes    Alcohol/week: 0.0 - 1.0 standard drinks    Comment: social- none since jan 08-2019   Drug use: Never   Sexual activity: Yes  Other Topics Concern   Not on file  Social History Narrative   Not on file   Social Determinants of Health   Financial Resource Strain: Not on file  Food Insecurity: Not on file  Transportation Needs: Not on file  Physical Activity: Not on file  Stress: Not on file  Social Connections: Not on file  Intimate Partner Violence: Not on file   Family History  Problem Relation Age of Onset   Healthy Mother    Breast cancer Mother    Hyperlipidemia Father    Hypertension Father    Healthy Sister    Lung cancer Maternal Grandmother        nonsmoker   Stroke Maternal Grandfather    Hyperlipidemia Paternal Grandfather    Colon polyps Maternal Uncle    Colon cancer Neg Hx    Esophageal cancer Neg Hx    Rectal cancer Neg Hx    Stomach cancer Neg Hx    BP (!) 150/82   Pulse 68   Ht '5\' 5"'$  (1.651 m)   Wt 75.3 kg (166 lb)   SpO2 95%   BMI 27.62 kg/m   Wt Readings from Last 3 Encounters:  02/21/21 75.3 kg (166 lb)  01/03/21 76.6 kg (168 lb 12.8 oz)  07/19/20 77 kg (169 lb 12.8 oz)   PHYSICAL EXAM: General:  Well appearing. No resp difficulty HEENT: normal Neck: supple. no JVD. Carotids 2+ bilat; no bruits. No lymphadenopathy or thryomegaly appreciated. Cor: PMI nondisplaced. Regular rate & rhythm. 2/6 MR Lungs: clear Abdomen: soft, nontender, nondistended. No hepatosplenomegaly. No bruits or masses. Good bowel sounds. Extremities: no cyanosis, clubbing, rash, edema Neuro: alert & orientedx3, cranial nerves grossly intact. moves all 4 extremities w/o difficulty. Affect pleasant  ECG: SR w/ PVCs (personally  reviewed).  ASSESSMENT & PLAN:  1. Mitral regurgitation  - Resting echo, TEE, stress echo and cath studies viewed by Dr. Haroldine Laws. - Due to Barlow's disease. - Resting echo (6/21): EF 40-45% with moderate to severe MR but when sedated/unloaded MR seems much less severe on cath and intra-op TEE - CPX (6/21): excellent functional capacity with mildly elevated VE/VCO2 slope suggested of increased pulmonary pressures with exercise - Stress echo (7/21): with moderate MR at baseline. Mildly worse with exercise. - Exercise RHC (8/21): Normal resting hemodynamics. Hypertensive responsive to exercise. Mild increase in PA pressures and PCWP/v-waves with exercise. Would continue watchful waiting of his MR. - Echo 8/22 EF 60-65 Barlow's type valve mild to mod MVR. Repeat CPX with excellent functional capacity but Ve/VCO2 slope 41 suggestive of significant increase in pulmonary pressures/dead space with exercise.  - Overall exercise tolerance is preserved but VE/VCO2 slope suggests progressive PAH with exercise. Will Refer to Dr. Delano [facility]z at [facility] for consideration of MVR. (I discussed with Dr. Cheree Ditto by phone).  2. Palpitations - With his degree of MR and LAE, he is at high risk for AF and other atrial dysrhythmias. - Zio (8/22) no high-grade arrhythmias - Zio (7/22) with occasional SVT.   3. Fatigue and snoring - check home sleep study  Glori Bickers, MD  02/21/21

## 2021-02-21 NOTE — Patient Instructions (Signed)
You have been referred to Dr Cheree Ditto at [facility], they will call you for an appointment  Your physician recommends that you schedule a follow-up appointment in: 3 months  If you have any questions or concerns before your next appointment please send [place] a message through [facility] or call our office at [phone].    TO LEAVE A MESSAGE FOR THE NURSE SELECT OPTION 2, PLEASE LEAVE A MESSAGE INCLUDING: YOUR NAME DATE OF BIRTH CALL BACK NUMBER REASON FOR CALL**this is important as we prioritize the call backs  YOU WILL RECEIVE A CALL BACK THE SAME DAY AS LONG AS YOU CALL BEFORE 4:00 PM  At the [facility], you and your health needs are our priority. As part of our continuing mission to provide you with exceptional heart care, we have created designated Provider Care Teams. These Care Teams include your primary Cardiologist (physician) and Advanced Practice Providers (APPs- Physician Assistants and Nurse Practitioners) who all work together to provide you with the care you need, when you need it.   You may see any of the following providers on your designated Care Team at your next follow up: Dr Glori Bickers Dr Loralie Champagne Dr Patrice Paradise, NP Lyda Jester, [place] Ginnie Smart Audry Riles, PharmD   Please be sure to bring in all your medications bottles to every appointment.

## 2021-02-26 ENCOUNTER — Telehealth (HOSPITAL_COMMUNITY): Payer: Self-pay | Admitting: *Deleted

## 2021-02-26 NOTE — Telephone Encounter (Signed)
2nd time faxing referral to [place] at [facility]  Fax # [phone]

## 2021-02-28 ENCOUNTER — Other Ambulatory Visit: Payer: Self-pay

## 2021-02-28 ENCOUNTER — Telehealth (INDEPENDENT_AMBULATORY_CARE_PROVIDER_SITE_OTHER): Payer: BC Managed Care – PPO | Admitting: Physician Assistant

## 2021-02-28 ENCOUNTER — Encounter: Payer: Self-pay | Admitting: Physician Assistant

## 2021-02-28 VITALS — Ht 65.0 in | Wt 163.0 lb

## 2021-02-28 DIAGNOSIS — U071 COVID-19: Secondary | ICD-10-CM

## 2021-02-28 MED ORDER — NIRMATRELVIR/RITONAVIR (PAXLOVID)TABLET: 3.0000 | ORAL_TABLET | Freq: Two times a day (BID) | ORAL | 0 refills | Status: AC

## 2021-02-28 NOTE — Progress Notes (Signed)
Virtual Visit via Video   I connected with Kevin Medina on 02/28/21 at 11:30 AM EDT by a video enabled telemedicine application and verified that I am speaking with the correct person using two identifiers. Location patient: Home Location provider: [facility], Office Persons participating in the virtual visit: Kevin, Mcmahen PA-C  I discussed the limitations of evaluation and management by telemedicine and the availability of in person appointments. The patient expressed understanding and agreed to proceed.  I acted as a Education administrator for [employer], PA-C [employer], LPN   Subjective:   HPI:   Patient is requesting evaluation for possible COVID-19.  Symptom onset: Wednesday   Travel/contacts: No exposure or travel   Vaccination status: 2 shots  Testing results: Pt had positive home test this morning.  Patient endorses the following symptoms: Sinus congestion, chills, headache, body ache, fatigue  Patient denies the following symptoms: No diarrhea, or nausea or vomiting, chest pain, SOB  Treatments tried: Mucinex  Patient risk factors: Current [id number] risk of complications score: 1 Smoking status: Kevin Medina  reports that he quit smoking about 23 years ago. His smoking use included cigarettes. He has a 32.00 pack-year smoking history. He has quit using smokeless tobacco. If male, currently pregnant? '[]'$   Yes '[]'$   No  ROS: See pertinent positives and negatives per HPI.  Patient Active Problem List   Diagnosis Date Noted   Mitral regurgitation    Mitral valve prolapse 07/14/2019    Social History   Tobacco Use   Smoking status: Former    Packs/day: 2.00    Years: 16.00    Pack years: 32.00    Types: Cigarettes    Quit date: 06/15/1997    Years since quitting: 23.7   Smokeless tobacco: Former  Substance Use Topics   Alcohol use: Yes    Alcohol/week: 0.0 - 1.0 standard drinks    Comment: social- none since jan 08-2019     Current Outpatient Medications:    fish oil-omega-3 fatty acids 1000 MG capsule, Take 4,500 mg by mouth daily. Sometimes takes more than '4500mg'$  daily, Disp: , Rfl:    Multiple Vitamin (MULTIVITAMIN) tablet, Take 1 tablet by mouth daily., Disp: , Rfl:    nirmatrelvir/ritonavir EUA (PAXLOVID) 20 x 150 MG & 10 x '100MG'$  TABS, Take 3 tablets by mouth 2 (two) times daily for 5 days. (Take nirmatrelvir 150 mg two tablets twice daily for 5 days and ritonavir 100 mg one tablet twice daily for 5 days) Patient GFR is 56, Disp: 30 tablet, Rfl: 0   psyllium (METAMUCIL) 58.6 % packet, Take 1 packet by mouth daily., Disp: , Rfl:    Turmeric 500 MG CAPS, Take 1,000 mg by mouth at bedtime., Disp: , Rfl:   Current Facility-Administered Medications:    sodium chloride flush (NS) 0.9 % injection 3 mL, 3 mL, Intravenous, Q12H, Gwenlyn Found Pearletha Forge, MD  Allergies  Allergen Reactions   Tape Itching and Rash    Objective:   VITALS: Per patient if applicable, see vitals. GENERAL: Alert, appears well and in no acute distress. HEENT: Atraumatic, conjunctiva clear, no obvious abnormalities on inspection of external nose and ears. NECK: Normal movements of the head and neck. CARDIOPULMONARY: No increased WOB. Speaking in clear sentences. I:E ratio WNL.  MS: Moves all visible extremities without noticeable abnormality. PSYCH: Pleasant and cooperative, well-groomed. Speech normal rate and rhythm. Affect is appropriate. Insight and judgement are appropriate. Attention is focused, linear, and appropriate.  NEURO:  CN grossly intact. Oriented as arrived to appointment on time with no prompting. Moves both UE equally.  SKIN: No obvious lesions, wounds, erythema, or cyanosis noted on face or hands.  Assessment and Plan:   Kevin Medina was seen today for covid positive and abstract.  Diagnoses and all orders for this visit:  COVID-19  Other orders -     nirmatrelvir/ritonavir EUA (PAXLOVID) 20 x 150 MG & 10 x '100MG'$  TABS;  Take 3 tablets by mouth 2 (two) times daily for 5 days. (Take nirmatrelvir 150 mg two tablets twice daily for 5 days and ritonavir 100 mg one tablet twice daily for 5 days) Patient GFR is 56   No red flags on discussion, patient is not in any obvious distress during our visit.  Patient is interested in [place]. I have sent this in for patient and also discussed that this medication is still under the FDA EUA and full long-term side effect profile is unknown.  Benefits/risks of medication discussed.  Patient currently has no contraindications for taking this medication.  We reviewed current medications and most recent GFR.  They are aware of risks of medication and wishes to proceed. I advised that this medication has numerous potential drug interactions and they should confirm with their pharmacist that this medication is safe to take with their current prescriptions prior to starting.  Discussed over the counter supportive care options, including Tylenol 500 mg q 8 hours, with recommendations to push fluids and rest. Reviewed return precautions including new/worsening fever, SOB, new/worsening cough, sudden onset changes of symptoms. Recommended need to self-quarantine and practice social distancing until symptoms resolve. I recommend that patient follow-up if symptoms worsen or persist despite treatment x 7-10 days, sooner if needed.  I discussed the assessment and treatment plan with the patient. The patient was provided an opportunity to ask questions and all were answered. The patient agreed with the plan and demonstrated an understanding of the instructions.   The patient was advised to call back or seek an in-person evaluation if the symptoms worsen or if the condition fails to improve as anticipated.  CMA or LPN served as scribe during this visit. History, Physical, and Plan performed by medical provider. The above documentation has been reviewed and is accurate and  complete.    [place], [place] 02/28/2021

## 2021-03-17 DIAGNOSIS — I341 Nonrheumatic mitral (valve) prolapse: Secondary | ICD-10-CM | POA: Diagnosis not present

## 2021-03-17 DIAGNOSIS — I340 Nonrheumatic mitral (valve) insufficiency: Secondary | ICD-10-CM | POA: Diagnosis not present

## 2021-03-28 DIAGNOSIS — G5601 Carpal tunnel syndrome, right upper limb: Secondary | ICD-10-CM | POA: Diagnosis not present

## 2021-03-28 DIAGNOSIS — M9907 Segmental and somatic dysfunction of upper extremity: Secondary | ICD-10-CM | POA: Diagnosis not present

## 2021-03-28 DIAGNOSIS — M9902 Segmental and somatic dysfunction of thoracic region: Secondary | ICD-10-CM | POA: Diagnosis not present

## 2021-03-31 DIAGNOSIS — G5601 Carpal tunnel syndrome, right upper limb: Secondary | ICD-10-CM | POA: Diagnosis not present

## 2021-03-31 DIAGNOSIS — M9907 Segmental and somatic dysfunction of upper extremity: Secondary | ICD-10-CM | POA: Diagnosis not present

## 2021-03-31 DIAGNOSIS — M9902 Segmental and somatic dysfunction of thoracic region: Secondary | ICD-10-CM | POA: Diagnosis not present

## 2021-04-04 DIAGNOSIS — M9907 Segmental and somatic dysfunction of upper extremity: Secondary | ICD-10-CM | POA: Diagnosis not present

## 2021-04-04 DIAGNOSIS — M9902 Segmental and somatic dysfunction of thoracic region: Secondary | ICD-10-CM | POA: Diagnosis not present

## 2021-04-04 DIAGNOSIS — G5601 Carpal tunnel syndrome, right upper limb: Secondary | ICD-10-CM | POA: Diagnosis not present

## 2021-04-07 DIAGNOSIS — M9907 Segmental and somatic dysfunction of upper extremity: Secondary | ICD-10-CM | POA: Diagnosis not present

## 2021-04-07 DIAGNOSIS — G5601 Carpal tunnel syndrome, right upper limb: Secondary | ICD-10-CM | POA: Diagnosis not present

## 2021-04-07 DIAGNOSIS — M9902 Segmental and somatic dysfunction of thoracic region: Secondary | ICD-10-CM | POA: Diagnosis not present

## 2021-04-07 NOTE — Telephone Encounter (Signed)
This concern has been previously addressed by myself and/or another provider.  If they patient has ongoing concerns, they can contact me at their convenience.  Thank you,  Rich Nychelle Cassata, NP 

## 2021-04-18 DIAGNOSIS — G5601 Carpal tunnel syndrome, right upper limb: Secondary | ICD-10-CM | POA: Diagnosis not present

## 2021-04-18 DIAGNOSIS — M9902 Segmental and somatic dysfunction of thoracic region: Secondary | ICD-10-CM | POA: Diagnosis not present

## 2021-04-18 DIAGNOSIS — M9907 Segmental and somatic dysfunction of upper extremity: Secondary | ICD-10-CM | POA: Diagnosis not present

## 2021-04-20 DIAGNOSIS — Z952 Presence of prosthetic heart valve: Secondary | ICD-10-CM | POA: Diagnosis not present

## 2021-04-20 DIAGNOSIS — I340 Nonrheumatic mitral (valve) insufficiency: Secondary | ICD-10-CM | POA: Diagnosis not present

## 2021-04-20 DIAGNOSIS — Z20822 Contact with and (suspected) exposure to covid-19: Secondary | ICD-10-CM | POA: Diagnosis not present

## 2021-04-20 DIAGNOSIS — Z8616 Personal history of COVID-19: Secondary | ICD-10-CM | POA: Diagnosis not present

## 2021-04-20 DIAGNOSIS — G8918 Other acute postprocedural pain: Secondary | ICD-10-CM | POA: Diagnosis not present

## 2021-04-20 DIAGNOSIS — Z91048 Other nonmedicinal substance allergy status: Secondary | ICD-10-CM | POA: Diagnosis not present

## 2021-04-20 DIAGNOSIS — Z87891 Personal history of nicotine dependence: Secondary | ICD-10-CM | POA: Diagnosis not present

## 2021-04-20 DIAGNOSIS — J90 Pleural effusion, not elsewhere classified: Secondary | ICD-10-CM | POA: Diagnosis not present

## 2021-04-20 DIAGNOSIS — E785 Hyperlipidemia, unspecified: Secondary | ICD-10-CM | POA: Diagnosis not present

## 2021-04-20 DIAGNOSIS — D62 Acute posthemorrhagic anemia: Secondary | ICD-10-CM | POA: Diagnosis not present

## 2021-04-20 DIAGNOSIS — I5032 Chronic diastolic (congestive) heart failure: Secondary | ICD-10-CM | POA: Diagnosis not present

## 2021-04-20 DIAGNOSIS — Z7982 Long term (current) use of aspirin: Secondary | ICD-10-CM | POA: Diagnosis not present

## 2021-04-20 DIAGNOSIS — I341 Nonrheumatic mitral (valve) prolapse: Secondary | ICD-10-CM | POA: Diagnosis not present

## 2021-04-20 DIAGNOSIS — Z4682 Encounter for fitting and adjustment of non-vascular catheter: Secondary | ICD-10-CM | POA: Diagnosis not present

## 2021-04-20 DIAGNOSIS — Z9889 Other specified postprocedural states: Secondary | ICD-10-CM | POA: Diagnosis not present

## 2021-04-20 DIAGNOSIS — J811 Chronic pulmonary edema: Secondary | ICD-10-CM | POA: Diagnosis not present

## 2021-04-20 DIAGNOSIS — I97618 Postprocedural hemorrhage and hematoma of a circulatory system organ or structure following other circulatory system procedure: Secondary | ICD-10-CM | POA: Diagnosis not present

## 2021-04-20 DIAGNOSIS — R578 Other shock: Secondary | ICD-10-CM | POA: Diagnosis not present

## 2021-04-21 HISTORY — PX: MITRAL VALVE REPAIR: SHX2039

## 2021-04-28 ENCOUNTER — Encounter: Payer: Self-pay | Admitting: Registered Nurse

## 2021-05-05 ENCOUNTER — Other Ambulatory Visit: Payer: Self-pay | Admitting: *Deleted

## 2021-05-05 DIAGNOSIS — Z9889 Other specified postprocedural states: Secondary | ICD-10-CM

## 2021-05-12 DIAGNOSIS — Z48812 Encounter for surgical aftercare following surgery on the circulatory system: Secondary | ICD-10-CM | POA: Diagnosis not present

## 2021-05-12 DIAGNOSIS — Z9889 Other specified postprocedural states: Secondary | ICD-10-CM | POA: Diagnosis not present

## 2021-05-12 DIAGNOSIS — J90 Pleural effusion, not elsewhere classified: Secondary | ICD-10-CM | POA: Diagnosis not present

## 2021-05-30 ENCOUNTER — Other Ambulatory Visit: Payer: Self-pay

## 2021-05-30 ENCOUNTER — Ambulatory Visit (HOSPITAL_COMMUNITY)
Admission: RE | Admit: 2021-05-30 | Discharge: 2021-05-30 | Disposition: A | Discharge status: Home / Self Care | Payer: BC Managed Care – PPO | Source: Ambulatory Visit | Attending: Internal Medicine | Admitting: Internal Medicine

## 2021-05-30 ENCOUNTER — Encounter (HOSPITAL_COMMUNITY): Payer: Self-pay | Admitting: Internal Medicine

## 2021-05-30 VITALS — BP 122/76 | HR 79 | Wt 167.2 lb

## 2021-05-30 DIAGNOSIS — Z9889 Other specified postprocedural states: Secondary | ICD-10-CM | POA: Diagnosis not present

## 2021-05-30 DIAGNOSIS — I341 Nonrheumatic mitral (valve) prolapse: Secondary | ICD-10-CM | POA: Diagnosis not present

## 2021-05-30 DIAGNOSIS — E785 Hyperlipidemia, unspecified: Secondary | ICD-10-CM | POA: Insufficient documentation

## 2021-05-30 DIAGNOSIS — R0683 Snoring: Secondary | ICD-10-CM | POA: Diagnosis not present

## 2021-05-30 DIAGNOSIS — R002 Palpitations: Secondary | ICD-10-CM

## 2021-05-30 DIAGNOSIS — Z952 Presence of prosthetic heart valve: Secondary | ICD-10-CM | POA: Diagnosis not present

## 2021-05-30 DIAGNOSIS — Z8616 Personal history of COVID-19: Secondary | ICD-10-CM | POA: Diagnosis not present

## 2021-05-30 DIAGNOSIS — I340 Nonrheumatic mitral (valve) insufficiency: Secondary | ICD-10-CM | POA: Insufficient documentation

## 2021-05-30 NOTE — Progress Notes (Signed)
ADVANCED HF CLINIC NOTE  PCP: Maximiano Coss, NP Primary Cardiologist: Gwenlyn Found HF Cardiologist: Dr. Haroldine Laws  HPI:  Kevin Medina is a 50 year old male with HL, mitral valve prolapse and mitral regurgitation.   First noted to have MVP ~15 years ago when he underwent an echo due to palpitations. He was told at the time that the valve is not leaking much and he did not need surgical intervention. Developed COVID-19 infection in October 2020.  After his recovery he experienced significant exertional shortness of breath with initial attempt to return to exercise. Echo in 2021 EF 40%  with bileaflet MVP with at least moderate MR and EF 40%  TEE confirmed the presence of bileaflet prolapse with "moderate to severe" MR.   LVEF 40 to 45%. RV normal. L/R cath -> No CAD and normal right heart pressures.    Seen by Dr. Roxy Manns and scheduled for MVR on 11/22/19. However initial intra-op TEE showed minimal MR so surgery was cancelled. Subsequently had CPX test and stress echo. CPX showed excellent functional capacity with mildly elevated VEVCO2 suggestive of increased pulmonary pressures with exercise. Stress echo with mod MR at rest and exercise. Subsequently underwent exercise RHC (as below) and MVR deferred due to only mild increase in PA pressures with exercise.   Seen back in Clinic 7/22 with recurrent DOE and palpitations. Echo 8/22 EF 60-65% Barlow's type valve mild to mod MVR. LA dilated. RV ok.  Zio (7/22) with occasional SVT. Repeat CPX 8/22 with excellent functional capacity but Ve/VCO2 slope 41 suggestive of significant increase in pulmonary pressures/dead space with exercise.   S/p MVR 04/21/21 w/ Dr. Cheree Ditto at [facility].  Today he returns for HF follow up. Feels much better since surgery, did not do CR, didn't need it. Now back to working fulltime. Working out, running 45 seconds, walking 2 minutes. No heavy lifting now, wants to know when he can start. Does not notice palpitations. Overall feeling fine. Denies CP,  dizziness, edema, or PND/Orthopnea. Appetite ok. No fever or chills. Weight stable. Taking all medications. No longer requiring pain meds. Resting HR now 70s. Wife says he snores but better since surgery.  Cardiac Studies: CPX 8/22: FVC 4.89 (117%)      FEV1 3.67 (111%)        FEV1/FVC 75 (94%)        MVV 180 (133%)   Resting HR: 77 Standing HR: 80 Peak HR: 163   (96% age predicted max HR)  BP rest: 130/68 Standing BP: 116/68 BP peak: 184/62  Peak VO2: 47.8 (136% predicted peak VO2)  VE/VCO2 slope:  41  OUES: 3.31  Peak RER: 1.00  Ventilatory Threshold: 33.2 (95% predicted or 69% measured peak VO2)  Peak RR 64  Peak Ventilation:  155.4  VE/MVV:  86%  PETCO2 at peak:  26  O2pulse:  22   (137% predicted O2pulse)   - Resting echo (6/21): EF 40-45% with moderate to severe MR but when sedated/unloaded MR seems much less severe on cath and intra-op TEE  - Stress echo (7/21): with moderate MR at baseline. Mildly worse with exercise. Excellent exercise capacity - the patient achieved 17 METS. Normal RVSP at rest and on peak exertion, The baseline PASP was estimated to be 13.6 mmHg. The PASP increased to 20.8 mmHg at peak stress. This is a low risk study.  - CPX (6/21):  FVC 4.64 (111%)      FEV1 3.52 (105%)        FEV1/FVC 76 (94%)  MVV 185 (136%)       Resting HR: 68 Standing HR: 71 Peak HR: 169   (99% age predicted max HR)  BP rest: 136/78 Standing BP: 132/72 BP peak: 198/66  Peak VO2: 49.8 (152% predicted peak VO2)  VE/VCO2 slope:  35  OUES: 3.29  Peak RER: 1.05  Ventilatory threshold: 39.1 (120% predicted or 79% measured peak VO2)  VE/MVV:  77%  O2pulse:  23   (153% predicted O2pulse)   - Stress echo (12/14/19):    1. Moderate to severe mitral regurgitation at rest and on exertion.     2. Normal RVSP at rest and on peak exertion.     3. The baseline PASP was estimated to be 13.6 mmHg. The PASP increased to  20.8 mmHg at peak stress.   Exercise RHC (8/21):    Ao =  158/84 (108) RA = 4 RV = 19/2 PA =  19/7 (12) PCW = 7 (no significant v-wave) Fick CO/CI = 4.9/2.7 Thermo cardiac output/index = 5.4/2.9 PVR = 1.0 WU Ao sat = 99% PA sat = 75%   Unloaded cycling (0W) Ao = 185/82 (116) PA = 31/10 (22) PCW = 13 (v =18) Thermo CO/CI = 11.9/6.5   Stage 1 (40W) Ao = 192/81 (118) PA = 33/12 (23) PCW = 14 (v =12) Thermo CO/CI = 12.1/6.5 PVR = 0.7 WU Ao sat = 98% PA sat = 98%   Stage 2 (65W) Ao = 207/79 (121) PA = 40/12 (25) PCW = 18 (v = 24) Thermo CO/CI = unobtainable   Stage 3 (90W) Ao = 214/77 (122) PA = 38/5 (22) PCW = 19 (v = 26) Thermo CO/CI = unobtainable   Stage 4 (115W) Ao = 232/83 (128) PA = 36/5 (24) PCW = 16 (v = 20) Thermo CO/CI = unobtainable Ao sat 98% PA sat 32%   Stage 5 (130W) Ao = 218/83 (127) PA = 42/4 (26) PCW = 12 (v = 22) Thermo CO/CI = unobtainable   Early recovery (easy cycling) PCW = 17 (v = 23)   Assessment:   Normal resting hemodynamics. Hypertensive responsive to exercise. Mild increase in PA pressures and PCWP/v-waves with exercise. Would continue watchful waiting of his MR.  ROS: All systems reviewed and negative except as per HPI.   Past Medical History:  Diagnosis Date   Allergy    Arrhythmia    Exercise-induced asthma    Heart murmur    due to MVP    Hyperlipidemia    Mitral regurgitation    Mitral valve prolapse    Current Outpatient Medications  Medication Sig Dispense Refill   aspirin 81 MG EC tablet Take by mouth.     fish oil-omega-3 fatty acids 1000 MG capsule Take 4,500 mg by mouth daily. Sometimes takes more than 4500mg  daily     metoprolol tartrate (LOPRESSOR) 25 MG tablet Take by mouth.     Multiple Vitamin (MULTIVITAMIN) tablet Take 1 tablet by mouth daily.     psyllium (METAMUCIL) 58.6 % packet Take 1 packet by mouth daily.     Current Facility-Administered Medications  Medication Dose Route Frequency Provider Last Rate Last Admin   sodium chloride flush (NS) 0.9  % injection 3 mL  3 mL Intravenous Q12H Lorretta Harp, MD       Allergies  Allergen Reactions   Tape Itching and Rash   Social History   Socioeconomic History   Marital status: Married    Spouse name: Not on file  Number of children: 1   Years of education: Not on file   Highest education level: Not on file  Occupational History   Not on file  Tobacco Use   Smoking status: Former    Packs/day: 2.00    Years: 16.00    Pack years: 32.00    Types: Cigarettes    Quit date: 06/15/1997    Years since quitting: 23.9   Smokeless tobacco: Former  Scientific laboratory technician Use: Never used  Substance and Sexual Activity   Alcohol use: Yes    Alcohol/week: 0.0 - 1.0 standard drinks    Comment: social- none since jan 08-2019   Drug use: Never   Sexual activity: Yes  Other Topics Concern   Not on file  Social History Narrative   Not on file   Social Determinants of Health   Financial Resource Strain: Not on file  Food Insecurity: Not on file  Transportation Needs: Not on file  Physical Activity: Not on file  Stress: Not on file  Social Connections: Not on file  Intimate Partner Violence: Not on file   Family History  Problem Relation Age of Onset   Healthy Mother    Breast cancer Mother    Hyperlipidemia Father    Hypertension Father    Healthy Sister    Lung cancer Maternal Grandmother        nonsmoker   Stroke Maternal Grandfather    Hyperlipidemia Paternal Grandfather    Colon polyps Maternal Uncle    Colon cancer Neg Hx    Esophageal cancer Neg Hx    Rectal cancer Neg Hx    Stomach cancer Neg Hx    BP 122/76    Pulse 79    Wt 75.8 kg (167 lb 3.2 oz)    SpO2 97%    BMI 27.82 kg/m   Wt Readings from Last 3 Encounters:  05/30/21 75.8 kg (167 lb 3.2 oz)  02/28/21 73.9 kg (163 lb)  02/21/21 75.3 kg (166 lb)   PHYSICAL EXAM: General:  NAD. No resp difficulty HEENT: Normal Neck: Supple. No JVD. Carotids 2+ bilat; no bruits. No lymphadenopathy or thryomegaly  appreciated. Cor: PMI nondisplaced. Regular rate & rhythm. No rubs, gallops or murmurs. Lungs: Clear Abdomen: Soft, nontender, nondistended. No hepatosplenomegaly. No bruits or masses. Good bowel sounds. Extremities: No cyanosis, clubbing, rash, edema Neuro: Alert & oriented x 3, cranial nerves grossly intact. Moves all 4 extremities w/o difficulty. Affect pleasant.  ECG: SR no ectopy (personally reviewed).  ASSESSMENT & PLAN:  1. Mitral regurgitation  - Resting echo, TEE, stress echo and cath studies viewed by Dr. Haroldine Laws. - Due to Barlow's disease. - Resting echo (6/21): EF 40-45% with moderate to severe MR but when sedated/unloaded MR seems much less severe on cath and intra-op TEE - CPX (6/21): excellent functional capacity with mildly elevated VE/VCO2 slope suggested of increased pulmonary pressures with exercise - Stress echo (7/21): with moderate MR at baseline. Mildly worse with exercise. - Exercise RHC (8/21): Normal resting hemodynamics. Hypertensive responsive to exercise. Mild increase in PA pressures and PCWP/v-waves with exercise. Would continue watchful waiting of his MR. - Echo 8/22 EF 60-65 Barlow's type valve mild to mod MVR. Repeat CPX with excellent functional capacity but Ve/VCO2 slope 41 suggestive of significant increase in pulmonary pressures/dead space with exercise.  - Overall exercise tolerance is preserved but VE/VCO2 slope suggests progressive PAH with exercise.  - s/p MVR 04/2021.  2. Palpitations - With his degree of MR  and LAE, he is at high risk for AF and other atrial dysrhythmias. - Zio (8/22) no high-grade arrhythmias - Zio (7/22) with occasional SVT.  - Asymptomatic today. - ECG shows SR no ectopy.  3. Snoring - Has home sleep study, has not completed.  Follow up in 6 months.  Rafael Bihari, FNP  05/30/21   Patient seen and examined with the above-signed Advanced Practice Provider and/or Housestaff. I personally reviewed laboratory  data, imaging studies and relevant notes. I independently examined the patient and formulated the important aspects of the plan. I have edited the note to reflect any of my changes or salient points. I have personally discussed the plan with the patient and/or family.  Underwent mini MV repair 04/21/21 at [place] (Dr. Cheree Ditto). Feels great. Back to interval running. Energy level much better. No SOB. No edema, orthopnea or PND.   General:  Well appearing. No resp difficulty HEENT: normal Neck: supple. no JVD. Carotids 2+ bilat; no bruits. No lymphadenopathy or thryomegaly appreciated. Cor: PMI nondisplaced. Mini-thoracotomy wound well healed.  Regular rate & rhythm. No rubs, gallops or murmurs. Lungs: clear Abdomen: soft, nontender, nondistended. No hepatosplenomegaly. No bruits or masses. Good bowel sounds. Extremities: no cyanosis, clubbing, rash, edema Neuro: alert & orientedx3, cranial nerves grossly intact. moves all 4 extremities w/o difficulty. Affect pleasant  ECG: NSR 82 No ST-T wave abnormalities. Personally reviewed  Doing great post-MVR. Will see back in 6 months with echo. Aware of SBE prophylaxis. Can stop metoprolol.Glori Bickers, MD  2:32 PM

## 2021-05-30 NOTE — Patient Instructions (Signed)
STOP Metoprolol  Your physician has requested that you have an echocardiogram. Echocardiography is a painless test that uses sound waves to create images of your heart. It provides your doctor with information about the size and shape of your heart and how well your hearts chambers and valves are working. This procedure takes approximately one hour. There are no restrictions for this procedure.  Your physician recommends that you schedule a follow-up appointment in: 6 months with Dr Haroldine Laws. Please call in April to schedule your echo and Dr visit.   Please call office at [phone] option [phone] if you have any questions or concerns.   At the [facility], you and your health needs are our priority. As part of our continuing mission to provide you with exceptional heart care, we have created designated Provider Care Teams. These Care Teams include your primary Cardiologist (physician) and Advanced Practice Providers (APPs- Physician Assistants and Nurse Practitioners) who all work together to provide you with the care you need, when you need it.   You may see any of the following providers on your designated Care Team at your next follow up: Dr Glori Bickers Dr Haynes Kerns, NP Lyda Jester, [facility] [place], [place] Audry Riles, PharmD   Please be sure to bring in all your medications bottles to every appointment.

## 2021-05-30 NOTE — Addendum Note (Signed)
Encounter addended by: Maple Mirza, RN on: 05/30/2021 2:42 PM  Actions taken: Order list changed, Diagnosis association updated, Clinical Note Signed

## 2021-06-24 ENCOUNTER — Telehealth: Payer: Self-pay | Admitting: Cardiovascular Disease

## 2021-06-24 DIAGNOSIS — Z952 Presence of prosthetic heart valve: Secondary | ICD-10-CM

## 2021-06-24 DIAGNOSIS — I341 Nonrheumatic mitral (valve) prolapse: Secondary | ICD-10-CM

## 2021-06-24 MED ORDER — AMOXICILLIN 500 MG PO TABS
2000.0000 mg | ORAL_TABLET | Freq: Once | ORAL | 0 refills | Status: AC
Start: 2021-06-24 — End: 2021-06-24

## 2021-06-24 NOTE — Telephone Encounter (Signed)
° °  Primary Cardiologist: Quay Burow, MD  Chart reviewed as part of pre-operative protocol coverage. Simple dental extractions are considered low risk procedures per guidelines and generally do not require any specific cardiac clearance. It is also generally accepted that for simple extractions and dental cleanings, there is no need to interrupt blood thinner therapy.   SBE prophylaxis is required for the patient.  Patient will need to take 2 g of amoxicillin 30-60 minutes prior to dental procedures.  I will route this recommendation to the requesting party via Epic fax function and remove from pre-op pool.  Please call with questions.  Deberah Pelton, NP 06/24/2021, 4:12 PM

## 2021-06-24 NOTE — Telephone Encounter (Signed)
° °  Pre-operative Risk Assessment    Patient Name: Kevin Medina  DOB: 05-27-1971 MRN: [id number]     Request for Surgical Clearance    Procedure:   Cleaning and  Xrays  Date of Surgery:  Clearance    08-12-21                              Surgeon:  Dr Morey Hummingbird, DDS Surgeon's Group or Practice Name:   Phone number:  [phone] Fax number:  [phone]   Type of Clearance Requested:  Medical- does patient need an Antibiotic?   Type of Anesthesia:  local   Additional requests/questions:    Signed, Glyn Ade   06/24/2021, 3:58 PM

## 2021-07-25 ENCOUNTER — Encounter: Payer: Self-pay | Admitting: Registered Nurse

## 2021-08-12 DIAGNOSIS — M9907 Segmental and somatic dysfunction of upper extremity: Secondary | ICD-10-CM | POA: Diagnosis not present

## 2021-08-12 DIAGNOSIS — M9902 Segmental and somatic dysfunction of thoracic region: Secondary | ICD-10-CM | POA: Diagnosis not present

## 2021-08-12 DIAGNOSIS — G5601 Carpal tunnel syndrome, right upper limb: Secondary | ICD-10-CM | POA: Diagnosis not present

## 2021-08-29 ENCOUNTER — Other Ambulatory Visit: Payer: Self-pay

## 2021-08-29 ENCOUNTER — Ambulatory Visit (INDEPENDENT_AMBULATORY_CARE_PROVIDER_SITE_OTHER): Payer: BC Managed Care – PPO | Admitting: Registered Nurse

## 2021-08-29 VITALS — BP 128/88 | HR 80 | Temp 98.0°F | Resp 17 | Ht 65.0 in | Wt 164.6 lb

## 2021-08-29 DIAGNOSIS — Z1159 Encounter for screening for other viral diseases: Secondary | ICD-10-CM | POA: Diagnosis not present

## 2021-08-29 DIAGNOSIS — Z13220 Encounter for screening for lipoid disorders: Secondary | ICD-10-CM

## 2021-08-29 DIAGNOSIS — Z1329 Encounter for screening for other suspected endocrine disorder: Secondary | ICD-10-CM

## 2021-08-29 DIAGNOSIS — Z0000 Encounter for general adult medical examination without abnormal findings: Secondary | ICD-10-CM

## 2021-08-29 DIAGNOSIS — Z13228 Encounter for screening for other metabolic disorders: Secondary | ICD-10-CM

## 2021-08-29 DIAGNOSIS — Z125 Encounter for screening for malignant neoplasm of prostate: Secondary | ICD-10-CM | POA: Diagnosis not present

## 2021-08-29 DIAGNOSIS — Z130 Encounter for screening for diseases of the blood and blood-forming organs and certain disorders involving the immune mechanism: Secondary | ICD-10-CM

## 2021-08-29 DIAGNOSIS — I341 Nonrheumatic mitral (valve) prolapse: Secondary | ICD-10-CM | POA: Diagnosis not present

## 2021-08-29 DIAGNOSIS — I340 Nonrheumatic mitral (valve) insufficiency: Secondary | ICD-10-CM | POA: Diagnosis not present

## 2021-08-29 DIAGNOSIS — Z1211 Encounter for screening for malignant neoplasm of colon: Secondary | ICD-10-CM

## 2021-08-29 NOTE — Patient Instructions (Addendum)
Kevin Medina - ? ?Great to see you, as always ? ?TAKE IT EASY - patience is going to be key, which stinks, but it will pay dividends. ? ?I'll let you know what Dr. Haroldine Laws has to say about you winning the race to [facility]. ? ?Thanks, ? ?Rich  ? ? ? ?If you have lab work done today you will be contacted with your lab results within the next 2 weeks.  If you have not heard from [place] then please contact us. The fastest way to get your results is to register for My Chart. ? ? ?IF you received an x-ray today, you will receive an invoice from [facility]. Please contact [facility] at [phone] with questions or concerns regarding your invoice.  ? ?IF you received labwork today, you will receive an invoice from [facility]. Please contact [facility] at [phone] with questions or concerns regarding your invoice.  ? ?Our billing staff will not be able to assist you with questions regarding bills from these companies. ? ?You will be contacted with the lab results as soon as they are available. The fastest way to get your results is to activate your My Chart account. Instructions are located on the last page of this paperwork. If you have not heard from [place] regarding the results in 2 weeks, please contact this office. ?  ? ? ?

## 2021-08-29 NOTE — Progress Notes (Signed)
? ?Established Patient Office Visit ? ?Subjective:  ?Patient ID: Kevin Medina, male    DOB: 1971-06-07  Age: 51 y.o. MRN: [id number] ? ?CC:  ?Chief Complaint  ?Patient presents with  ? Annual Exam  ?  Patient is here for physical. Patient has no other concerns   ? ? ?HPI ?Kevin Medina presents for CPE ? ?No acute concerns ? ?Labs collected. Will follow up with the patient as warranted. ? ? ?Past Medical History:  ?Diagnosis Date  ? Allergy   ? Arrhythmia   ? Exercise-induced asthma   ? Heart murmur   ? due to MVP   ? Hyperlipidemia   ? Mitral regurgitation   ? Mitral valve prolapse   ? ? ?Past Surgical History:  ?Procedure Laterality Date  ? COLONOSCOPY    ? POLYPECTOMY  15 yrs ago   ? ? type of polyps per pt   ? RIGHT HEART CATH N/A 02/02/2020  ? Procedure: RIGHT HEART CATH;  Surgeon: Jolaine Artist, MD;  Location: [place] [facility];  Service: Cardiovascular;  Laterality: N/A;  ? RIGHT/LEFT HEART CATH AND CORONARY ANGIOGRAPHY N/A 10/05/2019  ? Procedure: RIGHT/LEFT HEART CATH AND CORONARY ANGIOGRAPHY;  Surgeon: Lorretta Harp, MD;  Location: [place] [facility];  Service: Cardiovascular;  Laterality: N/A;  ? TEE WITHOUT CARDIOVERSION N/A 10/05/2019  ? Procedure: TRANSESOPHAGEAL ECHOCARDIOGRAM (TEE);  Surgeon: Lelon Perla, MD;  Location: [facility];  Service: Cardiovascular;  Laterality: N/A;  ? TEE WITHOUT CARDIOVERSION N/A 11/22/2019  ? Procedure: TRANSESOPHAGEAL ECHOCARDIOGRAM (TEE);  Surgeon: Rexene Alberts, MD;  Location: [place];  Service: Open Heart Surgery;  Laterality: N/A;  ? VASECTOMY    ? ? ?Family History  ?Problem Relation Age of Onset  ? Healthy Mother   ? Breast cancer Mother   ? Hyperlipidemia Father   ? Hypertension Father   ? Healthy Sister   ? Lung cancer Maternal Grandmother   ?     nonsmoker  ? Stroke Maternal Grandfather   ? Hyperlipidemia Paternal Grandfather   ? Colon polyps Maternal Uncle   ? Colon cancer Neg Hx   ? Esophageal cancer Neg Hx   ? Rectal cancer Neg Hx   ?  Stomach cancer Neg Hx   ? ? ?Social History  ? ?Socioeconomic History  ? Marital status: Married  ?  Spouse name: Not on file  ? Number of children: 1  ? Years of education: Not on file  ? Highest education level: Not on file  ?Occupational History  ? Not on file  ?Tobacco Use  ? Smoking status: Former  ?  Packs/day: 2.00  ?  Years: 16.00  ?  Pack years: 32.00  ?  Types: Cigarettes  ?  Quit date: 06/15/1997  ?  Years since quitting: 24.2  ? Smokeless tobacco: Former  ?Vaping Use  ? Vaping Use: Never used  ?Substance and Sexual Activity  ? Alcohol use: Yes  ?  Alcohol/week: 0.0 - 1.0 standard drinks  ?  Comment: social- none since jan 08-2019  ? Drug use: Never  ? Sexual activity: Yes  ?Other Topics Concern  ? Not on file  ?Social History Narrative  ? Not on file  ? ?Social Determinants of Health  ? ?Financial Resource Strain: Not on file  ?Food Insecurity: Not on file  ?Transportation Needs: Not on file  ?Physical Activity: Not on file  ?Stress: Not on file  ?Social Connections: Not on file  ?Intimate Partner Violence: Not  on file  ? ? ?Outpatient Medications Prior to Visit  ?Medication Sig Dispense Refill  ? aspirin 81 MG EC tablet Take by mouth.    ? fish oil-omega-3 fatty acids 1000 MG capsule Take 4,500 mg by mouth daily. Sometimes takes more than '4500mg'$  daily    ? Multiple Vitamin (MULTIVITAMIN) tablet Take 1 tablet by mouth daily.    ? psyllium (METAMUCIL) 58.6 % packet Take 1 packet by mouth daily.    ? ?Facility-Administered Medications Prior to Visit  ?Medication Dose Route Frequency Provider Last Rate Last Admin  ? sodium chloride flush (NS) 0.9 % injection 3 mL  3 mL Intravenous Q12H Lorretta Harp, MD      ? ? ?Allergies  ?Allergen Reactions  ? Tape Itching and Rash  ? ? ?ROS ?Review of Systems  ?Constitutional: Negative.   ?HENT: Negative.    ?Eyes: Negative.   ?Respiratory: Negative.    ?Cardiovascular: Negative.   ?Gastrointestinal: Negative.   ?Genitourinary: Negative.   ?Musculoskeletal: Negative.    ?Skin: Negative.   ?Neurological: Negative.   ?Psychiatric/Behavioral: Negative.    ?All other systems reviewed and are negative. ? ?  ?Objective:  ?  ?Physical Exam ?Vitals and nursing note reviewed.  ?Constitutional:   ?   General: He is not in acute distress. ?   Appearance: Normal appearance. He is normal weight. He is not ill-appearing, toxic-appearing or diaphoretic.  ?HENT:  ?   Head: Normocephalic and atraumatic.  ?   Right Ear: Tympanic membrane, ear canal and external ear normal. There is no impacted cerumen.  ?   Left Ear: Tympanic membrane, ear canal and external ear normal. There is no impacted cerumen.  ?   Nose: Nose normal. No congestion or rhinorrhea.  ?   Mouth/Throat:  ?   Mouth: Mucous membranes are moist.  ?   Pharynx: Oropharynx is clear. No oropharyngeal exudate or posterior oropharyngeal erythema.  ?Eyes:  ?   General: No scleral icterus.    ?   Right eye: No discharge.     ?   Left eye: No discharge.  ?   Extraocular Movements: Extraocular movements intact.  ?   Conjunctiva/sclera: Conjunctivae normal.  ?   Pupils: Pupils are equal, round, and reactive to light.  ?Neck:  ?   Vascular: No carotid bruit.  ?Cardiovascular:  ?   Rate and Rhythm: Normal rate and regular rhythm.  ?   Pulses: Normal pulses.  ?   Heart sounds: Normal heart sounds. No murmur heard. ?  No friction rub. No gallop.  ?Pulmonary:  ?   Effort: Pulmonary effort is normal. No respiratory distress.  ?   Breath sounds: Normal breath sounds. No stridor. No wheezing, rhonchi or rales.  ?Chest:  ?   Chest wall: No tenderness.  ?Abdominal:  ?   General: Abdomen is flat. Bowel sounds are normal. There is no distension.  ?   Palpations: Abdomen is soft. There is no mass.  ?   Tenderness: There is no abdominal tenderness. There is no right CVA tenderness, left CVA tenderness, guarding or rebound.  ?   Hernia: No hernia is present.  ?Musculoskeletal:     ?   General: No swelling, tenderness, deformity or signs of injury. Normal range  of motion.  ?   Cervical back: Normal range of motion and neck supple. No rigidity or tenderness.  ?   Right lower leg: No edema.  ?   Left lower leg: No edema.  ?Lymphadenopathy:  ?  Cervical: No cervical adenopathy.  ?Skin: ?   General: Skin is warm and dry.  ?   Capillary Refill: Capillary refill takes less than 2 seconds.  ?   Coloration: Skin is not jaundiced or pale.  ?   Findings: No bruising, erythema, lesion or rash.  ?Neurological:  ?   General: No focal deficit present.  ?   Mental Status: He is alert and oriented to person, place, and time. Mental status is at baseline.  ?   Cranial Nerves: No cranial nerve deficit.  ?   Motor: No weakness.  ?   Gait: Gait normal.  ?Psychiatric:     ?   Mood and Affect: Mood normal.     ?   Behavior: Behavior normal.     ?   Thought Content: Thought content normal.     ?   Judgment: Judgment normal.  ? ? ?BP 128/88   Pulse 80   Temp 98 ?F (36.7 ?C) (Temporal)   Resp 17   Ht '5\' 5"'$  (1.651 m)   Wt 164 lb 9.6 oz (74.7 kg)   SpO2 100%   BMI 27.39 kg/m?  ?Wt Readings from Last 3 Encounters:  ?08/29/21 164 lb 9.6 oz (74.7 kg)  ?05/30/21 167 lb 3.2 oz (75.8 kg)  ?02/28/21 163 lb (73.9 kg)  ? ? ? ?Health Maintenance Due  ?Topic Date Due  ? Hepatitis C Screening  Never done  ? COLONOSCOPY (Pts 45-4yr Insurance coverage will need to be confirmed)  Never done  ? ? ?There are no preventive care reminders to display for this patient. ? ?Lab Results  ?Component Value Date  ? TSH 3.010 07/19/2020  ? ?Lab Results  ?Component Value Date  ? WBC 6.4 07/19/2020  ? HGB 13.9 07/19/2020  ? HCT 40.6 07/19/2020  ? MCV 91 07/19/2020  ? PLT 234 02/02/2020  ? ?Lab Results  ?Component Value Date  ? NA 141 07/19/2020  ? K 4.8 07/19/2020  ? CO2 25 07/19/2020  ? GLUCOSE 89 07/19/2020  ? BUN 13 07/19/2020  ? CREATININE 1.43 (H) 07/19/2020  ? BILITOT 0.5 07/19/2020  ? ALKPHOS 64 07/19/2020  ? AST 36 07/19/2020  ? ALT 51 (H) 07/19/2020  ? PROT 6.7 07/19/2020  ? ALBUMIN 4.8 07/19/2020  ? CALCIUM  9.6 07/19/2020  ? ANIONGAP 8 02/02/2020  ? ?Lab Results  ?Component Value Date  ? CHOL 265 (H) 07/19/2020  ? ?Lab Results  ?Component Value Date  ? HDL 60 07/19/2020  ? ?Lab Results  ?Component Value Date  ? L

## 2021-09-01 ENCOUNTER — Encounter: Payer: Self-pay | Admitting: Registered Nurse

## 2021-09-01 LAB — CBC WITH DIFFERENTIAL/PLATELET
Absolute Monocytes: 396 cells/uL (ref 200–950)
Basophils Absolute: 59 cells/uL (ref 0–200)
Basophils Relative: 1.3 %
Eosinophils Absolute: 50 cells/uL (ref 15–500)
Eosinophils Relative: 1.1 %
HCT: 42.6 % (ref 38.5–50.0)
Hemoglobin: 14 g/dL (ref 13.2–17.1)
Lymphs Abs: 1796 cells/uL (ref 850–3900)
MCH: 26.9 pg — ABNORMAL LOW (ref 27.0–33.0)
MCHC: 32.9 g/dL (ref 32.0–36.0)
MCV: 81.8 fL (ref 80.0–100.0)
MPV: 10.1 fL (ref 7.5–12.5)
Monocytes Relative: 8.8 %
Neutro Abs: 2201 cells/uL (ref 1500–7800)
Neutrophils Relative %: 48.9 %
Platelets: 266 10*3/uL (ref 140–400)
RBC: 5.21 10*6/uL (ref 4.20–5.80)
RDW: 15.9 % — ABNORMAL HIGH (ref 11.0–15.0)
Total Lymphocyte: 39.9 %
WBC: 4.5 10*3/uL (ref 3.8–10.8)

## 2021-09-01 LAB — HEPATITIS C ANTIBODY
Hepatitis C Ab: NONREACTIVE
SIGNAL TO CUT-OFF: 0.63 (ref <1.00)

## 2021-09-01 LAB — TSH: TSH: 2.98 mIU/L (ref 0.40–4.50)

## 2021-09-01 LAB — HEMOGLOBIN A1C
Hgb A1c MFr Bld: 5.5 % of total Hgb (ref <5.7)
Mean Plasma Glucose: 111 mg/dL
eAG (mmol/L): 6.2 mmol/L

## 2021-09-01 LAB — COMPREHENSIVE METABOLIC PANEL
AG Ratio: 2.2 (calc) (ref 1.0–2.5)
ALT: 26 U/L (ref 9–46)
AST: 24 U/L (ref 10–35)
Albumin: 4.7 g/dL (ref 3.6–5.1)
Alkaline phosphatase (APISO): 49 U/L (ref 35–144)
BUN: 10 mg/dL (ref 7–25)
CO2: 23 mmol/L (ref 20–32)
Calcium: 9.9 mg/dL (ref 8.6–10.3)
Chloride: 102 mmol/L (ref 98–110)
Creat: 1.19 mg/dL (ref 0.70–1.30)
Globulin: 2.1 g/dL (calc) (ref 1.9–3.7)
Glucose, Bld: 80 mg/dL (ref 65–99)
Potassium: 4.2 mmol/L (ref 3.5–5.3)
Sodium: 141 mmol/L (ref 135–146)
Total Bilirubin: 0.6 mg/dL (ref 0.2–1.2)
Total Protein: 6.8 g/dL (ref 6.1–8.1)

## 2021-09-01 LAB — LIPID PANEL
Cholesterol: 256 mg/dL — ABNORMAL HIGH (ref <200)
HDL: 54 mg/dL (ref >=40)
LDL Cholesterol (Calc): 184 mg/dL (calc) — ABNORMAL HIGH
Non-HDL Cholesterol (Calc): 202 mg/dL (calc) — ABNORMAL HIGH (ref <130)
Total CHOL/HDL Ratio: 4.7 (calc) (ref <5.0)
Triglycerides: 78 mg/dL (ref <150)

## 2021-09-01 LAB — PSA: PSA: 0.7 ng/mL (ref <=4.00)

## 2021-09-05 IMAGING — CT CT ANGIO CHEST
4 of 10 series · 11 of 36 positions shown · non-contrast
Comparison: None.

CLINICAL DATA: Mitral valve regurgitation. Preop. Evaluate for
dissection.

EXAM:
CT ANGIOGRAPHY CHEST, ABDOMEN AND PELVIS
TECHNIQUE: Non-contrast CT of the chest was initially obtained.

[Series 10: cta cap 2.00 bv36 s3 axial arterial · axial · arterial · 0.57mm/px · z∈[+1236,+1708]mm · 6 of 332 slices shown]
[im 48/332  lung]
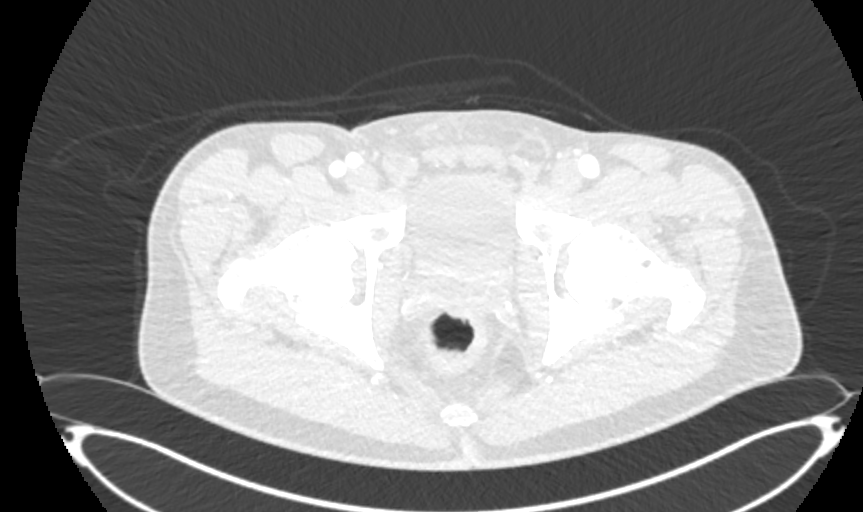
[im 95/332  mediastinal]
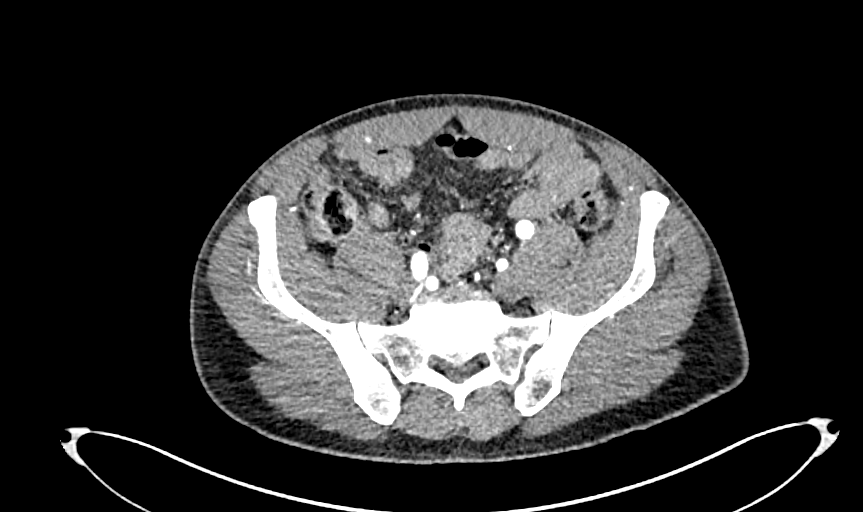
[im 142/332  lung]
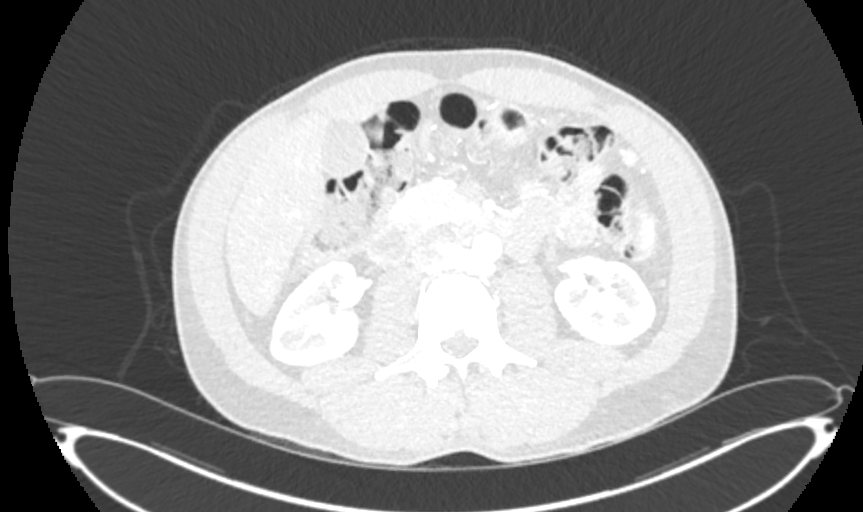
[im 190/332  mediastinal]
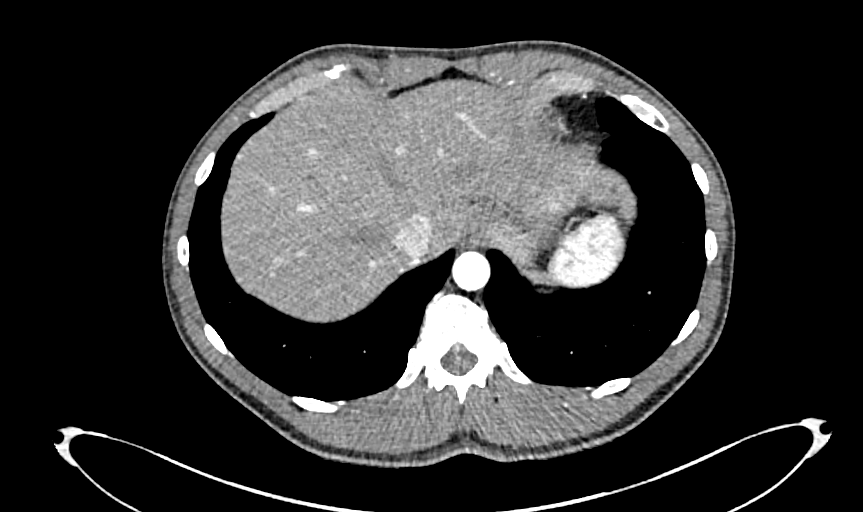
[im 237/332  lung]
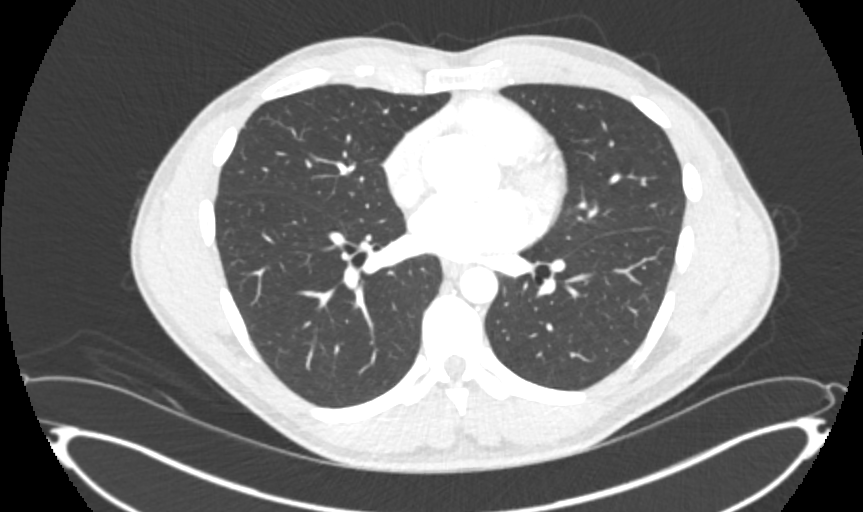
[im 284/332  mediastinal]
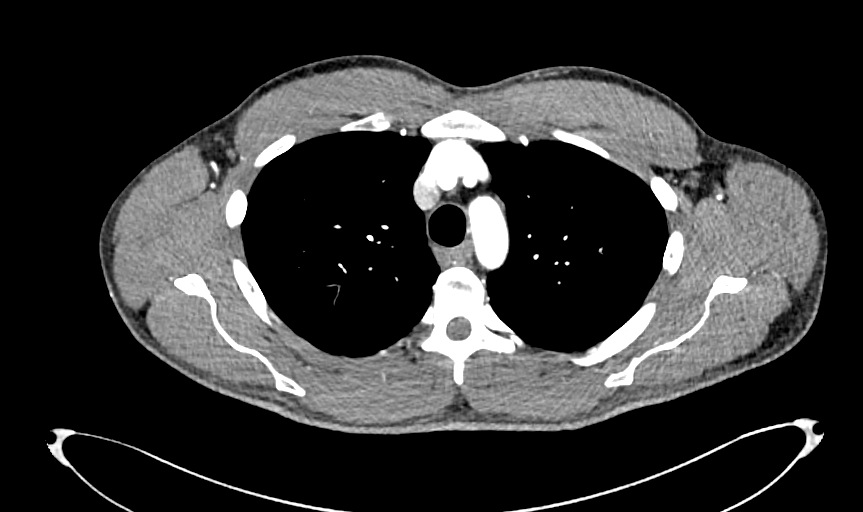

[Series 12: cta cap 2.00 bv36 s3 cor st · coronal · 0.77mm/px · 1 of 135 slices shown]
[im 68/135  mediastinal]
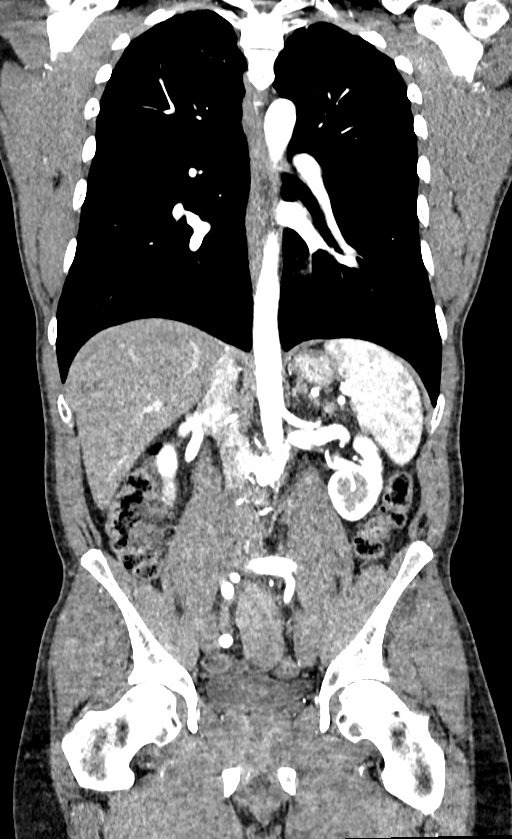

[Series 16: cta thorax without 2.00 br40 s3 axial arterial · axial · arterial · 0.63mm/px · z∈[+1558,+1674]mm · 2 of 174 slices shown]
[im 58/174  lung]
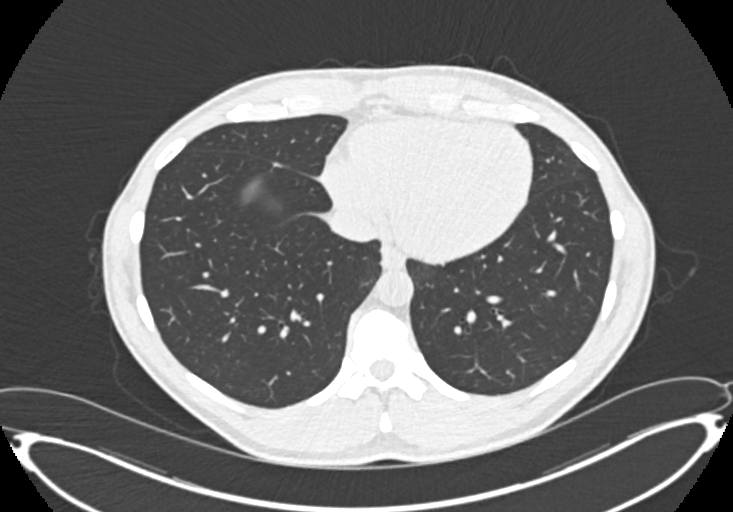
[im 116/174  lung]
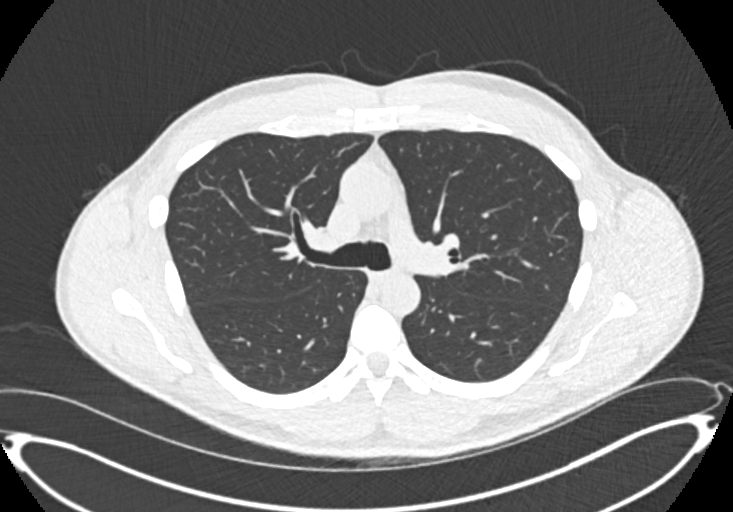

[Series 18: cta thorax without 2.00 br60 s3 axial arterial · axial · arterial · 0.63mm/px · z∈[+1558,+1674]mm · 2 of 174 slices shown]
[im 58/174  lung]
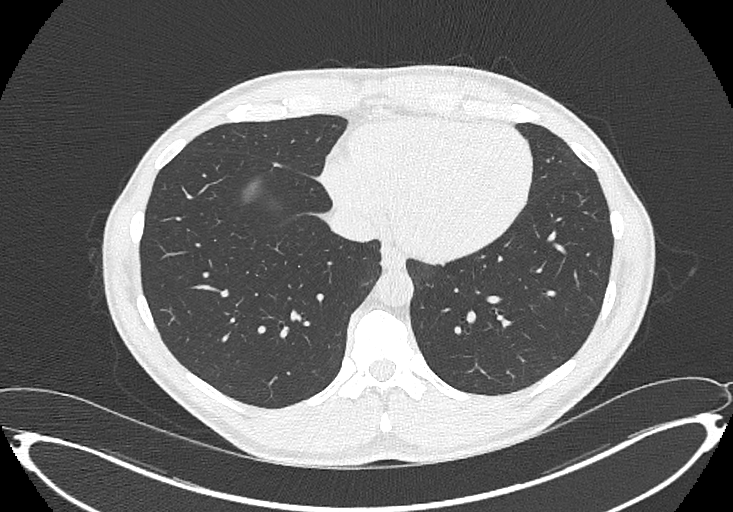
[im 116/174  lung]
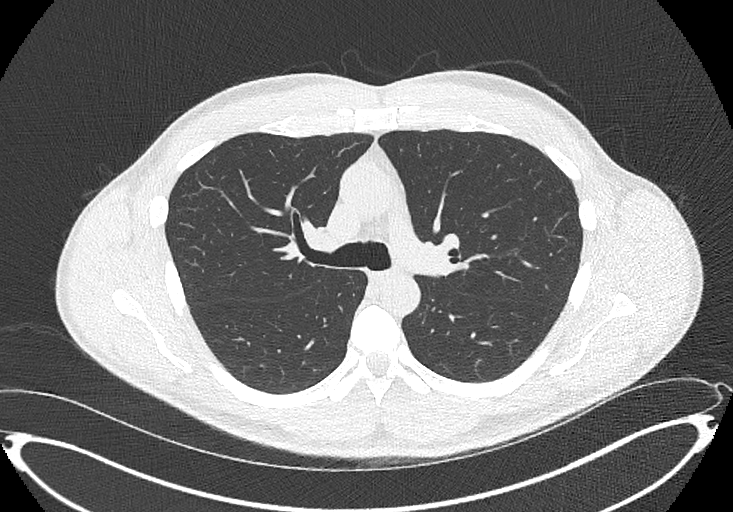

[11 of 36 positions shown; findings below may reference images not displayed]

Multidetector CT imaging through the chest, abdomen and pelvis was
performed using the standard protocol during bolus administration of
intravenous contrast. Multiplanar reconstructed images and MIPs were
obtained and reviewed to evaluate the vascular anatomy.

CONTRAST:  75mL VYO4KN-AG1 IOPAMIDOL (VYO4KN-AG1) INJECTION 76%
FINDINGS: CTA CHEST FINDINGS

Cardiovascular: Heart is normal size. The aortic root at the sinuses
of Valsalva measures maximally 4 cm. Ascending thoracic aorta
cm. Descending thoracic aorta 2.2 cm proximally. No evidence of
aortic dissection. Scattered calcifications in the aortic arch.

Mediastinum/Nodes: No mediastinal, hilar, or axillary adenopathy.
Trachea and esophagus are unremarkable. Thyroid unremarkable.

Lungs/Pleura: Biapical scarring. No confluent opacities or
effusions.

Musculoskeletal: Chest wall soft tissues are unremarkable. No acute
bony abnormality.

Review of the MIP images confirms the above findings.

CTA ABDOMEN AND PELVIS FINDINGS

VASCULAR

Aorta: Normal caliber aorta without aneurysm, dissection, vasculitis
or significant stenosis.

Celiac: Patent without evidence of aneurysm, dissection, vasculitis
or significant stenosis.

SMA: Patent without evidence of aneurysm, dissection, vasculitis or
significant stenosis.

Renals: Both renal arteries are patent without evidence of aneurysm,
dissection, vasculitis, fibromuscular dysplasia or significant
stenosis.

IMA: Patent without evidence of aneurysm, dissection, vasculitis or
significant stenosis.

Inflow: Patent without evidence of aneurysm, dissection, vasculitis
or significant stenosis.

Veins: No obvious venous abnormality within the limitations of this
arterial phase study. Incidentally noted is a retroaortic left renal
vein.

Review of the MIP images confirms the above findings.

NON-VASCULAR

Hepatobiliary: No focal hepatic abnormality. Gallbladder
unremarkable.

Pancreas: No focal abnormality or ductal dilatation.

Spleen: No focal abnormality.  Normal size.

Adrenals/Urinary Tract: 2.4 cm cyst in the midpole of the left
kidney. 2.2 cm cyst in the upper pole of the right kidney. No
hydronephrosis. Adrenal glands and urinary bladder unremarkable.

Stomach/Bowel: Stomach, large and small bowel grossly unremarkable.

Lymphatic: No adenopathy

Reproductive: No visible focal abnormality.

Other: No free fluid or free air.

Musculoskeletal: No acute bony abnormality.

Review of the MIP images confirms the above findings.
IMPRESSION: Slight dilatation of the aortic root measuring 4 cm maximally at the
sinuses of Valsalva. No evidence of aortic dissection.

No acute findings in the chest, abdomen or pelvis.

Bilateral simple appearing renal cysts.

## 2021-09-11 ENCOUNTER — Encounter: Payer: Self-pay | Admitting: Gastroenterology

## 2021-09-26 ENCOUNTER — Encounter (HOSPITAL_COMMUNITY): Payer: Self-pay | Admitting: Internal Medicine

## 2021-09-26 ENCOUNTER — Other Ambulatory Visit (HOSPITAL_COMMUNITY): Payer: Self-pay | Admitting: *Deleted

## 2021-09-26 ENCOUNTER — Ambulatory Visit (AMBULATORY_SURGERY_CENTER): Payer: BC Managed Care – PPO | Admitting: *Deleted

## 2021-09-26 VITALS — Ht 65.0 in | Wt 165.0 lb

## 2021-09-26 DIAGNOSIS — Z1211 Encounter for screening for malignant neoplasm of colon: Secondary | ICD-10-CM

## 2021-09-26 MED ORDER — NA SULFATE-K SULFATE-MG SULF 17.5-3.13-1.6 GM/177ML PO SOLN: 2.0000 | Freq: Once | ORAL | 0 refills | Status: AC

## 2021-09-26 MED ORDER — AMOXICILLIN 500 MG PO CAPS: ORAL_CAPSULE | ORAL | 0 refills | Status: DC

## 2021-09-26 NOTE — Progress Notes (Signed)
No egg or soy allergy known to patient  ?No issues known to pt with past sedation with any surgeries or procedures ?Patient denies ever being told they had issues or difficulty with intubation  ?No FH of Malignant Hyperthermia ?Pt is not on diet pills ?Pt is not on  home 02  ?Pt is not on blood thinners  ?Pt denies issues with constipation  ?No A fib or A flutter ? ? ?PV completed over the phone. Pt verified name, DOB, address and insurance during PV today.  ? ?Pt encouraged to call with questions or issues.  ?If pt has My chart, procedure instructions sent via My Chart   ?

## 2021-10-10 ENCOUNTER — Encounter: Payer: BC Managed Care – PPO | Admitting: Gastroenterology

## 2021-10-24 ENCOUNTER — Encounter: Payer: Self-pay | Admitting: Gastroenterology

## 2021-10-31 ENCOUNTER — Ambulatory Visit (AMBULATORY_SURGERY_CENTER): Payer: BC Managed Care – PPO | Admitting: Gastroenterology

## 2021-10-31 ENCOUNTER — Other Ambulatory Visit: Payer: Self-pay | Admitting: Gastroenterology

## 2021-10-31 ENCOUNTER — Encounter: Payer: Self-pay | Admitting: Gastroenterology

## 2021-10-31 VITALS — BP 114/73 | HR 69 | Temp 98.4°F | Resp 16 | Ht 65.0 in | Wt 165.0 lb

## 2021-10-31 DIAGNOSIS — Z1211 Encounter for screening for malignant neoplasm of colon: Secondary | ICD-10-CM

## 2021-10-31 DIAGNOSIS — D124 Benign neoplasm of descending colon: Secondary | ICD-10-CM | POA: Diagnosis not present

## 2021-10-31 MED ORDER — SODIUM CHLORIDE 0.9 % IV SOLN: 500.0000 mL | Freq: Once | INTRAVENOUS | Status: DC

## 2021-10-31 NOTE — Progress Notes (Signed)
HPI: This is a man at routine risk for colon cancer   ROS: complete GI ROS as described in HPI, all other review negative.  Constitutional:  No unintentional weight loss   Past Medical History:  Diagnosis Date   Allergy    Arrhythmia    Exercise-induced asthma    Heart murmur    due to MVP    Hyperlipidemia    Mitral regurgitation    Mitral valve prolapse     Past Surgical History:  Procedure Laterality Date   COLONOSCOPY     MITRAL VALVE REPAIR  04/21/2021   POLYPECTOMY  15 yrs ago    ? type of polyps per pt    RIGHT HEART CATH N/A 02/02/2020   Procedure: RIGHT HEART CATH;  Surgeon: Jolaine Artist, MD;  Location: [facility];  Service: Cardiovascular;  Laterality: N/A;   RIGHT/LEFT HEART CATH AND CORONARY ANGIOGRAPHY N/A 10/05/2019   Procedure: RIGHT/LEFT HEART CATH AND CORONARY ANGIOGRAPHY;  Surgeon: Lorretta Harp, MD;  Location: [facility];  Service: Cardiovascular;  Laterality: N/A;   TEE WITHOUT CARDIOVERSION N/A 10/05/2019   Procedure: TRANSESOPHAGEAL ECHOCARDIOGRAM (TEE);  Surgeon: Lelon Perla, MD;  Location: [facility];  Service: Cardiovascular;  Laterality: N/A;   TEE WITHOUT CARDIOVERSION N/A 11/22/2019   Procedure: TRANSESOPHAGEAL ECHOCARDIOGRAM (TEE);  Surgeon: Rexene Alberts, MD;  Location: [facility];  Service: Open Heart Surgery;  Laterality: N/A;   VASECTOMY      Current Outpatient Medications  Medication Sig Dispense Refill   amoxicillin (AMOXIL) 500 MG capsule Take 4 capsules 1 hour prior to procedure. 4 capsule 0   aspirin 81 MG EC tablet Take by mouth.     fish oil-omega-3 fatty acids 1000 MG capsule Take 4,500 mg by mouth daily. Sometimes takes more than '4500mg'$  daily     Multiple Vitamin (MULTIVITAMIN) tablet Take 1 tablet by mouth daily.     psyllium (METAMUCIL) 58.6 % packet Take 1 packet by mouth daily.     Current Facility-Administered Medications  Medication Dose Route Frequency Provider Last Rate Last Admin    sodium chloride flush (NS) 0.9 % injection 3 mL  3 mL Intravenous Q12H Lorretta Harp, MD        Allergies as of 10/31/2021 - Review Complete 10/31/2021  Allergen Reaction Noted   Tape Itching and Rash 01/16/2020    Family History  Problem Relation Age of Onset   Healthy Mother    Breast cancer Mother    Hyperlipidemia Father    Hypertension Father    Healthy Sister    Lung cancer Maternal Grandmother        nonsmoker   Stroke Maternal Grandfather    Hyperlipidemia Paternal Grandfather    Colon polyps Maternal Uncle    Colon cancer Neg Hx    Esophageal cancer Neg Hx    Rectal cancer Neg Hx    Stomach cancer Neg Hx     Social History   Socioeconomic History   Marital status: Married    Spouse name: Not on file   Number of children: 1   Years of education: Not on file   Highest education level: Not on file  Occupational History   Not on file  Tobacco Use   Smoking status: Former    Packs/day: 2.00    Years: 16.00    Pack years: 32.00    Types: Cigarettes    Quit date: 06/15/1997    Years since quitting: 24.3  Smokeless tobacco: Former  Scientific laboratory technician Use: Never used  Substance and Sexual Activity   Alcohol use: Yes    Alcohol/week: 0.0 - 1.0 standard drinks    Comment: social- none since jan 08-2019   Drug use: Never   Sexual activity: Yes  Other Topics Concern   Not on file  Social History Narrative   Not on file   Social Determinants of Health   Financial Resource Strain: Not on file  Food Insecurity: Not on file  Transportation Needs: Not on file  Physical Activity: Not on file  Stress: Not on file  Social Connections: Not on file  Intimate Partner Violence: Not on file     Physical Exam:  Constitutional: generally well-appearing Psychiatric: alert and oriented x3 Lungs: CTA bilaterally Heart: no MCR  Assessment and plan: 51 y.o. male with routine risk for colon cancer  Screening colonoscopy today  Care is appropriate for the  ambulatory setting.  Owens Loffler, MD [facility] 10/31/2021, 12:45 PM

## 2021-10-31 NOTE — Progress Notes (Signed)
Called to room to assist during endoscopic procedure.  Patient ID and intended procedure confirmed with present staff. Received instructions for my participation in the procedure from the performing physician.  

## 2021-10-31 NOTE — Progress Notes (Signed)
Vitals-DT  History reviewed. 

## 2021-10-31 NOTE — Op Note (Signed)
[facility] Patient Name: Kevin Medina Procedure Date: 10/31/2021 12:49 PM MRN: [id number] Endoscopist: Milus Banister , MD Age: 51 Referring MD:  Date of Birth: 1971-04-29 Gender: Male Account #: [id number] Procedure:                Colonoscopy Indications:              Screening for colorectal malignant neoplasm Medicines:                Monitored Anesthesia Care Procedure:                Pre-Anesthesia Assessment:                           - Prior to the procedure, a History and Physical                            was performed, and patient medications and                            allergies were reviewed. The patient's tolerance of                            previous anesthesia was also reviewed. The risks                            and benefits of the procedure and the sedation                            options and risks were discussed with the patient.                            All questions were answered, and informed consent                            was obtained. Prior Anticoagulants: The patient has                            taken no previous anticoagulant or antiplatelet                            agents. ASA Grade Assessment: II - A patient with                            mild systemic disease. After reviewing the risks                            and benefits, the patient was deemed in                            satisfactory condition to undergo the procedure.                           After obtaining informed consent, the colonoscope  was passed under direct vision. Throughout the                            procedure, the patient's blood pressure, pulse, and                            oxygen saturations were monitored continuously. The                            Colonoscope was introduced through the anus and                            advanced to the the cecum, identified by                            appendiceal orifice and  ileocecal valve. The                            colonoscopy was performed without difficulty. The                            patient tolerated the procedure well. The quality                            of the bowel preparation was good. The ileocecal                            valve, appendiceal orifice, and rectum were                            photographed. Scope In: 1:23:59 PM Scope Out: 1:36:43 PM Scope Withdrawal Time: 0 hours 8 minutes 21 seconds  Total Procedure Duration: 0 hours 12 minutes 44 seconds  Findings:                 A 3 mm polyp was found in the descending colon. The                            polyp was sessile. The polyp was removed with a                            cold snare. Resection and retrieval were complete.                           The exam was otherwise without abnormality on                            direct and retroflexion views. Complications:            No immediate complications. Estimated blood loss:                            None. Estimated Blood Loss:     Estimated blood loss: none. Impression:               -  One 3 mm polyp in the descending colon, removed                            with a cold snare. Resected and retrieved.                           - The examination was otherwise normal on direct                            and retroflexion views. Recommendation:           - Patient has a contact number available for                            emergencies. The signs and symptoms of potential                            delayed complications were discussed with the                            patient. Return to normal activities tomorrow.                            Written discharge instructions were provided to the                            patient.                           - Resume previous diet.                           - Continue present medications.                           - Await pathology results. Milus Banister, MD 10/31/2021  1:38:50 PM This report has been signed electronically.

## 2021-10-31 NOTE — Progress Notes (Signed)
Report to PACU, RN, vss, BBS= Clear.  

## 2021-10-31 NOTE — Patient Instructions (Signed)
YOU HAD AN ENDOSCOPIC PROCEDURE TODAY AT THE [facility]:   Refer to the procedure report that was given to you for any specific questions about what was found during the examination.  If the procedure report does not answer your questions, please call your gastroenterologist to clarify.  If you requested that your care partner not be given the details of your procedure findings, then the procedure report has been included in a sealed envelope for you to review at your convenience later.  YOU SHOULD EXPECT: Some feelings of bloating in the abdomen. Passage of more gas than usual.  Walking can help get rid of the air that was put into your GI tract during the procedure and reduce the bloating. If you had a lower endoscopy (such as a colonoscopy or flexible sigmoidoscopy) you may notice spotting of blood in your stool or on the toilet paper. If you underwent a bowel prep for your procedure, you may not have a normal bowel movement for a few days.  Please Note:  You might notice some irritation and congestion in your nose or some drainage.  This is from the oxygen used during your procedure.  There is no need for concern and it should clear up in a day or so.  SYMPTOMS TO REPORT IMMEDIATELY:   Following lower endoscopy (colonoscopy or flexible sigmoidoscopy):  Excessive amounts of blood in the stool  Significant tenderness or worsening of abdominal pains  Swelling of the abdomen that is new, acute  Fever of 100F or higher  For urgent or emergent issues, a gastroenterologist can be reached at any hour by calling [phone]. Do not use MyChart messaging for urgent concerns.    DIET:  We do recommend a small meal at first, but then you may proceed to your regular diet.  Drink plenty of fluids but you should avoid alcoholic beverages for 24 hours.  ACTIVITY:  You should plan to take it easy for the rest of today and you should NOT DRIVE or use heavy machinery until tomorrow (because  of the sedation medicines used during the test).    FOLLOW UP: Our staff will call the number listed on your records 48-72 hours following your procedure to check on you and address any questions or concerns that you may have regarding the information given to you following your procedure. If we do not reach you, we will leave a message.  We will attempt to reach you two times.  During this call, we will ask if you have developed any symptoms of COVID 19. If you develop any symptoms (ie: fever, flu-like symptoms, shortness of breath, cough etc.) before then, please call [phone].  If you test positive for Covid 19 in the 2 weeks post procedure, please call and report this information to us.    If any biopsies were taken you will be contacted by phone or by letter within the next 1-3 weeks.  Please call us at [phone] if you have not heard about the biopsies in 3 weeks.    SIGNATURES/CONFIDENTIALITY: You and/or your care partner have signed paperwork which will be entered into your electronic medical record.  These signatures attest to the fact that that the information above on your After Visit Summary has been reviewed and is understood.  Full responsibility of the confidentiality of this discharge information lies with you and/or your care-partner. 

## 2021-11-03 ENCOUNTER — Telehealth: Payer: Self-pay | Admitting: *Deleted

## 2021-11-03 NOTE — Telephone Encounter (Signed)
First follow up call attempt.  LVM. 

## 2021-11-06 ENCOUNTER — Encounter: Payer: Self-pay | Admitting: Gastroenterology

## 2021-12-05 ENCOUNTER — Ambulatory Visit (HOSPITAL_COMMUNITY)
Admission: RE | Admit: 2021-12-05 | Discharge: 2021-12-05 | Disposition: A | Discharge status: Home / Self Care | Payer: BC Managed Care – PPO | Source: Ambulatory Visit | Attending: Registered Nurse | Admitting: Registered Nurse

## 2021-12-05 ENCOUNTER — Encounter (HOSPITAL_COMMUNITY): Payer: Self-pay | Admitting: Internal Medicine

## 2021-12-05 ENCOUNTER — Other Ambulatory Visit (HOSPITAL_COMMUNITY): Payer: Self-pay

## 2021-12-05 ENCOUNTER — Ambulatory Visit (HOSPITAL_BASED_OUTPATIENT_CLINIC_OR_DEPARTMENT_OTHER)
Admission: RE | Admit: 2021-12-05 | Discharge: 2021-12-05 | Disposition: A | Discharge status: Home / Self Care | Payer: BC Managed Care – PPO | Source: Ambulatory Visit | Attending: Internal Medicine | Admitting: Internal Medicine

## 2021-12-05 VITALS — BP 122/80 | HR 81 | Wt 162.4 lb

## 2021-12-05 DIAGNOSIS — R002 Palpitations: Secondary | ICD-10-CM | POA: Diagnosis not present

## 2021-12-05 DIAGNOSIS — I083 Combined rheumatic disorders of mitral, aortic and tricuspid valves: Secondary | ICD-10-CM | POA: Diagnosis not present

## 2021-12-05 DIAGNOSIS — Z954 Presence of other heart-valve replacement: Secondary | ICD-10-CM | POA: Diagnosis not present

## 2021-12-05 DIAGNOSIS — I341 Nonrheumatic mitral (valve) prolapse: Secondary | ICD-10-CM

## 2021-12-05 DIAGNOSIS — I471 Supraventricular tachycardia: Secondary | ICD-10-CM | POA: Insufficient documentation

## 2021-12-05 DIAGNOSIS — Z952 Presence of prosthetic heart valve: Secondary | ICD-10-CM | POA: Diagnosis not present

## 2021-12-05 DIAGNOSIS — I5032 Chronic diastolic (congestive) heart failure: Secondary | ICD-10-CM

## 2021-12-05 DIAGNOSIS — Z8249 Family history of ischemic heart disease and other diseases of the circulatory system: Secondary | ICD-10-CM | POA: Insufficient documentation

## 2021-12-05 DIAGNOSIS — Z87891 Personal history of nicotine dependence: Secondary | ICD-10-CM | POA: Diagnosis not present

## 2021-12-05 DIAGNOSIS — I5040 Unspecified combined systolic (congestive) and diastolic (congestive) heart failure: Secondary | ICD-10-CM | POA: Diagnosis not present

## 2021-12-05 DIAGNOSIS — Z8616 Personal history of COVID-19: Secondary | ICD-10-CM | POA: Insufficient documentation

## 2021-12-05 DIAGNOSIS — I428 Other cardiomyopathies: Secondary | ICD-10-CM | POA: Insufficient documentation

## 2021-12-05 DIAGNOSIS — I110 Hypertensive heart disease with heart failure: Secondary | ICD-10-CM | POA: Diagnosis not present

## 2021-12-05 LAB — ECHOCARDIOGRAM COMPLETE
Area-P 1/2: 4.68 cm2
Calc EF: 48.9 %
MV M vel: 5.32 m/s
MV Peak grad: 113.2 mmHg
MV VTI: 3.82 cm2
Radius: 0.6 cm
S' Lateral: 2.7 cm
Single Plane A2C EF: 57.3 %
Single Plane A4C EF: 36.1 %

## 2021-12-05 LAB — BASIC METABOLIC PANEL
Anion gap: 8 (ref 5–15)
BUN: 13 mg/dL (ref 6–20)
CO2: 28 mmol/L (ref 22–32)
Calcium: 9.5 mg/dL (ref 8.9–10.3)
Chloride: 104 mmol/L (ref 98–111)
Creatinine, Ser: 1.26 mg/dL — ABNORMAL HIGH (ref 0.61–1.24)
GFR, Estimated: >60 mL/min (ref >60)
Glucose, Bld: 97 mg/dL (ref 70–99)
Potassium: 4.3 mmol/L (ref 3.5–5.1)
Sodium: 140 mmol/L (ref 135–145)

## 2021-12-05 LAB — BRAIN NATRIURETIC PEPTIDE: B Natriuretic Peptide: 16.2 pg/mL (ref 0.0–100.0)

## 2021-12-05 MED ORDER — EMPAGLIFLOZIN 10 MG PO TABS: 10.0000 mg | ORAL_TABLET | Freq: Every day | ORAL | 3 refills | Status: DC

## 2022-01-07 ENCOUNTER — Other Ambulatory Visit (HOSPITAL_COMMUNITY): Payer: Self-pay | Admitting: Internal Medicine

## 2022-01-07 MED ORDER — AMOXICILLIN 500 MG PO CAPS: ORAL_CAPSULE | ORAL | 0 refills | Status: DC

## 2022-01-09 ENCOUNTER — Telehealth: Payer: Self-pay

## 2022-01-09 NOTE — Chronic Care Management (AMB) (Signed)
  Care Management   Outreach Note  01/09/2022 Name: Kevin Medina MRN: [id number] DOB: 1970/09/16  An unsuccessful telephone outreach was attempted today. The patient was referred to the case management team for assistance with care management and care coordination.   Follow Up Plan:  A HIPAA compliant phone message was left for the patient providing contact information and requesting a return call.  The care management team will reach out to the patient again over the next 7 days.  If patient returns call to provider office, please advise to call [facility] * at [phone]*  Noreene Larsson, [place], [place] [address] Direct Dial: [phone] [email or web address]

## 2022-01-15 ENCOUNTER — Ambulatory Visit: Payer: Self-pay

## 2022-01-15 NOTE — Patient Outreach (Signed)
  Care Coordination   01/15/2022 Name: Kevin Medina MRN: [id number] DOB: [birth year]   Care Coordination Outreach Attempts:  A second unsuccessful outreach was attempted today to offer the patient with information about available care coordination services as a benefit of their health plan.     Follow Up Plan:  Additional outreach attempts will be made to offer the patient care coordination information and services.   Encounter Outcome:  No Answer  Care Coordination Interventions Activated:  No   Care Coordination Interventions:  No, not indicated    Daneen Schick, BSW, CDP Social Worker, Certified Dementia Practitioner Care Coordination [phone]

## 2022-01-29 NOTE — Chronic Care Management (AMB) (Signed)
  Care Management   Outreach Note  01/29/2022 Name: Kevin Medina MRN: [id number] DOB: 1971/02/26  Third unsuccessful telephone outreach was attempted today. The patient was referred to the case management team for assistance with care management and care coordination. The patient's primary care provider has been notified of our unsuccessful attempts to make or maintain contact with the patient. The care management team is pleased to engage with this patient at any time in the future should he/she be interested in assistance from the care management team.   Follow Up Plan:  A HIPAA compliant phone message was left for the patient providing contact information and requesting a return call.   Noreene Larsson, [place], [place] [address] Direct Dial: [phone] [email or web address]

## 2022-01-30 NOTE — Chronic Care Management (AMB) (Signed)
  Care Coordination  Note  01/30/2022 Name: Kevin Medina MRN: [id number] DOB: 1971/06/10  Kevin Medina is a 51 y.o. year old male who is a primary care patient of Maximiano Coss, NP. I reached out to [employer] by phone today to offer care coordination services.      Mr. Bagshaw was given information about Care Coordination services today including:  The Care Coordination services include support from the care team which includes your Nurse Coordinator, Clinical Social Worker, or Pharmacist.  The Care Coordination team is here to help remove barriers to the health concerns and goals most important to you. Care Coordination services are voluntary and the patient may decline or stop services at any time by request to their care team member.   Patient did not agree to participate in care coordination services at this time.  Follow up plan: Patient declines further follow up or participation in care coordination services.   Noreene Larsson, [place], [place] [address] Direct Dial: [phone] [email or web address]

## 2022-03-03 NOTE — Progress Notes (Signed)
ADVANCED HF CLINIC NOTE  PCP: Maximiano Coss, NP Primary Cardiologist: Gwenlyn Found HF Cardiologist: Dr. Haroldine Laws  HPI:  Kevin Medina is a 51 year old male with HL, mitral valve prolapse and mitral regurgitation.   First noted to have MVP ~15 years ago when he underwent an echo due to palpitations. He was told at the time that the valve is not leaking much and he did not need surgical intervention. Developed COVID-19 infection in October 2020.  After his recovery he experienced significant exertional shortness of breath with initial attempt to return to exercise. Echo in 2021 EF 40%  with bileaflet MVP with at least moderate MR and EF 40%  TEE confirmed the presence of bileaflet prolapse with "moderate to severe" MR.   LVEF 40 to 45%. RV normal. L/R cath -> No CAD and normal right heart pressures.    Seen by Dr. Roxy Manns and scheduled for MVR on 11/22/19. However initial intra-op TEE showed minimal MR so surgery was cancelled. Subsequently had CPX test and stress echo. CPX showed excellent functional capacity with mildly elevated VEVCO2 suggestive of increased pulmonary pressures with exercise. Stress echo with mod MR at rest and exercise. Subsequently underwent exercise RHC (as below) and MVR deferred due to only mild increase in PA pressures with exercise.   Seen back in Clinic 7/22 with recurrent DOE and palpitations. Echo 8/22 EF 60-65% Barlow's type valve mild to mod MVR. LA dilated. RV ok.  Zio (7/22) with occasional SVT. Repeat CPX 8/22 with excellent functional capacity but Ve/VCO2 slope 41 suggestive of significant increase in pulmonary pressures/dead space with exercise.   S/p MVR 04/21/21 w/ Dr. Cheree Ditto at [facility].  Echo 6/23 EF 40-45% mild to moderate MR with MV ring. Started on Jardiance 10.  Today he returns for HF follow up with his wife. Overall feeling fine. Walks 4 miles/day with weighted vest and does body weight work. Continues to work out frequently with no SOB. Had some chest discomfort if HR >  140. Stopped his Vania Rea as it made him fatigued and caused increased thirst and ice cream cravings.. Denies palpitations, dizziness, edema, or PND/Orthopnea. Appetite ok. No fever or chills. Frustrated as he wants to lift heavy weights and run, but has been instructed to limit this.    Cardiac Studies: - Echo (6/23): EF 40-45%, mild to moderate MR with MV ring.  - CPX 8/22: FVC 4.89 (117%)      FEV1 3.67 (111%)        FEV1/FVC 75 (94%)        MVV 180 (133%)   Resting HR: 77 Standing HR: 80 Peak HR: 163   (96% age predicted max HR)  BP rest: 130/68 Standing BP: 116/68 BP peak: 184/62  Peak VO2: 47.8 (136% predicted peak VO2)  VE/VCO2 slope:  41  OUES: 3.31  Peak RER: 1.00  Ventilatory Threshold: 33.2 (95% predicted or 69% measured peak VO2)  Peak RR 64  Peak Ventilation:  155.4  VE/MVV:  86%  PETCO2 at peak:  26  O2pulse:  22   (137% predicted O2pulse)   - Stress echo (7/21): with moderate MR at baseline. Mildly worse with exercise. Excellent exercise capacity - the patient achieved 17 METS. Normal RVSP at rest and on peak exertion, The baseline PASP was estimated to be 13.6 mmHg. The PASP increased to 20.8 mmHg at peak stress. This is a low risk study.   - Exercise RHC (8/21):    Ao = 158/84 (108) RA = 4 RV = 19/2 PA =  19/7 (12) PCW = 7 (no significant v-wave) Fick CO/CI = 4.9/2.7 Thermo cardiac output/index = 5.4/2.9 PVR = 1.0 WU Ao sat = 99% PA sat = 75%   Unloaded cycling (0W) Ao = 185/82 (116) PA = 31/10 (22) PCW = 13 (v =18) Thermo CO/CI = 11.9/6.5   Stage 1 (40W) Ao = 192/81 (118) PA = 33/12 (23) PCW = 14 (v =12) Thermo CO/CI = 12.1/6.5 PVR = 0.7 WU Ao sat = 98% PA sat = 98%   Stage 2 (65W) Ao = 207/79 (121) PA = 40/12 (25) PCW = 18 (v = 24) Thermo CO/CI = unobtainable   Stage 3 (90W) Ao = 214/77 (122) PA = 38/5 (22) PCW = 19 (v = 26) Thermo CO/CI = unobtainable   Stage 4 (115W) Ao = 232/83 (128) PA = 36/5 (24) PCW = 16 (v =  20) Thermo CO/CI = unobtainable Ao sat 98% PA sat 32%   Stage 5 (130W) Ao = 218/83 (127) PA = 42/4 (26) PCW = 12 (v = 22) Thermo CO/CI = unobtainable   Early recovery (easy cycling) PCW = 17 (v = 23)   Assessment:   Normal resting hemodynamics. Hypertensive responsive to exercise. Mild increase in PA pressures and PCWP/v-waves with exercise. Would continue watchful waiting of his MR.  ROS: All systems reviewed and negative except as per HPI.   Past Medical History:  Diagnosis Date   Allergy    Arrhythmia    Exercise-induced asthma    Heart murmur    due to MVP    Hyperlipidemia    Mitral regurgitation    Mitral valve prolapse    Current Outpatient Medications  Medication Sig Dispense Refill   amoxicillin (AMOXIL) 500 MG capsule Take 4 capsules 1 hour prior to procedure. 4 capsule 0   aspirin 81 MG EC tablet Take by mouth.     fish oil-omega-3 fatty acids 1000 MG capsule Take 4,500 mg by mouth daily. Sometimes takes more than '4500mg'$  daily     Multiple Vitamin (MULTIVITAMIN) tablet Take 1 tablet by mouth daily.     psyllium (METAMUCIL) 58.6 % packet Take 1 packet by mouth daily.     empagliflozin (JARDIANCE) 10 MG TABS tablet Take 1 tablet (10 mg total) by mouth daily before breakfast. (Patient not taking: Reported on 03/06/2022) 90 tablet 3   No current facility-administered medications for this encounter.   Allergies  Allergen Reactions   Tape Itching and Rash   Social History   Socioeconomic History   Marital status: Married    Spouse name: Not on file   Number of children: 1   Years of education: Not on file   Highest education level: Not on file  Occupational History   Not on file  Tobacco Use   Smoking status: Former    Packs/day: 2.00    Years: 16.00    Total pack years: 32.00    Types: Cigarettes    Quit date: 06/15/1997    Years since quitting: 24.7   Smokeless tobacco: Former  Scientific laboratory technician Use: Never used  Substance and Sexual Activity    Alcohol use: Yes    Alcohol/week: 0.0 - 1.0 standard drinks of alcohol    Comment: social- none since jan 08-2019   Drug use: Never   Sexual activity: Yes  Other Topics Concern   Not on file  Social History Narrative   Not on file   Social Determinants of Health   Financial Resource  Strain: Not on file  Food Insecurity: Not on file  Transportation Needs: Not on file  Physical Activity: Not on file  Stress: Not on file  Social Connections: Not on file  Intimate Partner Violence: Not on file   Family History  Problem Relation Age of Onset   Healthy Mother    Breast cancer Mother    Hyperlipidemia Father    Hypertension Father    Healthy Sister    Lung cancer Maternal Grandmother        nonsmoker   Stroke Maternal Grandfather    Hyperlipidemia Paternal Grandfather    Colon polyps Maternal Uncle    Colon cancer Neg Hx    Esophageal cancer Neg Hx    Rectal cancer Neg Hx    Stomach cancer Neg Hx    BP 118/86   Pulse 82   Wt 75.4 kg (166 lb 3.2 oz)   SpO2 97%   BMI 27.66 kg/m   Wt Readings from Last 3 Encounters:  03/06/22 75.4 kg (166 lb 3.2 oz)  12/05/21 73.7 kg (162 lb 6.4 oz)  10/31/21 74.8 kg (165 lb)   PHYSICAL EXAM: General:  NAD. No resp difficulty HEENT: Normal Neck: Supple. No JVD. Carotids 2+ bilat; no bruits. No lymphadenopathy or thryomegaly appreciated. Cor: PMI nondisplaced. Regular rate & rhythm. No rubs, gallops or murmurs. Lungs: Clear Abdomen: Soft, nontender, nondistended. No hepatosplenomegaly. No bruits or masses. Good bowel sounds. Extremities: No cyanosis, clubbing, rash, edema Neuro: Alert & oriented x 3, cranial nerves grossly intact. Moves all 4 extremities w/o difficulty. Affect pleasant.  ASSESSMENT & PLAN: 1. Mitral regurgitation in setting of Barlow's disease - s/p mini-MVR 04/2021 with Dr. Cheree Ditto at [facility].  - Echo (12/05/21): EF 40-45% mild to moderate MR. - Will need continued surveillance.  - Aware of SBE prophylaxis. - Discussed  with Dr. Haroldine Laws, [place] to resume previous exercise routine as tolerated. - Repeat echo in 3-4 months.  2. Non-ischemic CM - Echo 12/05/21 EF 40-45% mild to moderate MR - NYHA I. Volume looks good today. - BP has been soft but improved today. - OK to stop Jardiance with SE. - Not interested in starting other GDMT. - BMET and BNP today.  3. Palpitations - Zio (8/22) no high-grade arrhythmias. - Zio (7/22) with occasional SVT.  - Improved.  Follow up in 3 - 4 months with Dr. Haroldine Laws + echo.  [place], FNP  03/06/22

## 2022-03-06 ENCOUNTER — Encounter (HOSPITAL_COMMUNITY): Payer: Self-pay

## 2022-03-06 ENCOUNTER — Ambulatory Visit (HOSPITAL_COMMUNITY)
Admission: RE | Admit: 2022-03-06 | Discharge: 2022-03-06 | Disposition: A | Discharge status: Home / Self Care | Payer: BC Managed Care – PPO | Source: Ambulatory Visit | Attending: Family Medicine | Admitting: Family Medicine

## 2022-03-06 VITALS — BP 118/86 | HR 82 | Wt 166.2 lb

## 2022-03-06 DIAGNOSIS — R002 Palpitations: Secondary | ICD-10-CM | POA: Diagnosis not present

## 2022-03-06 DIAGNOSIS — I428 Other cardiomyopathies: Secondary | ICD-10-CM | POA: Insufficient documentation

## 2022-03-06 DIAGNOSIS — I340 Nonrheumatic mitral (valve) insufficiency: Secondary | ICD-10-CM | POA: Insufficient documentation

## 2022-03-06 DIAGNOSIS — Z952 Presence of prosthetic heart valve: Secondary | ICD-10-CM | POA: Diagnosis not present

## 2022-03-06 DIAGNOSIS — I5032 Chronic diastolic (congestive) heart failure: Secondary | ICD-10-CM | POA: Diagnosis not present

## 2022-03-06 DIAGNOSIS — I471 Supraventricular tachycardia: Secondary | ICD-10-CM | POA: Diagnosis not present

## 2022-03-06 DIAGNOSIS — I110 Hypertensive heart disease with heart failure: Secondary | ICD-10-CM | POA: Insufficient documentation

## 2022-03-06 DIAGNOSIS — I509 Heart failure, unspecified: Secondary | ICD-10-CM | POA: Diagnosis not present

## 2022-03-06 DIAGNOSIS — I341 Nonrheumatic mitral (valve) prolapse: Secondary | ICD-10-CM | POA: Insufficient documentation

## 2022-03-06 LAB — BASIC METABOLIC PANEL
Anion gap: 9 (ref 5–15)
BUN: 13 mg/dL (ref 6–20)
CO2: 23 mmol/L (ref 22–32)
Calcium: 9.4 mg/dL (ref 8.9–10.3)
Chloride: 105 mmol/L (ref 98–111)
Creatinine, Ser: 1.13 mg/dL (ref 0.61–1.24)
GFR, Estimated: >60 mL/min (ref >60)
Glucose, Bld: 96 mg/dL (ref 70–99)
Potassium: 4 mmol/L (ref 3.5–5.1)
Sodium: 137 mmol/L (ref 135–145)

## 2022-03-06 LAB — BRAIN NATRIURETIC PEPTIDE: B Natriuretic Peptide: 11.4 pg/mL (ref 0.0–100.0)

## 2022-03-06 NOTE — Patient Instructions (Addendum)
Thank you for coming in today  Labs were done today, if any labs are abnormal the clinic will call you No news is good news  STOP [place]   Your physician recommends that you schedule a follow-up appointment in:  3 months with Dr. Haroldine Laws with echocardiogram  Your physician has requested that you have an echocardiogram. Echocardiography is a painless test that uses sound waves to create images of your heart. It provides your doctor with information about the size and shape of your heart and how well your heart's chambers and valves are working. This procedure takes approximately one hour. There are no restrictions for this procedure.     Do the following things EVERYDAY: Weigh yourself in the morning before breakfast. Write it down and keep it in a log. Take your medicines as prescribed Eat low salt foods--Limit salt (sodium) to 2000 mg per day.  Stay as active as you can everyday Limit all fluids for the day to less than 2 liters   At the [facility], you and your health needs are our priority. As part of our continuing mission to provide you with exceptional heart care, we have created designated Provider Care Teams. These Care Teams include your primary Cardiologist (physician) and Advanced Practice Providers (APPs- Physician Assistants and Nurse Practitioners) who all work together to provide you with the care you need, when you need it.   You may see any of the following providers on your designated Care Team at your next follow up: Dr Glori Bickers Dr Loralie Champagne Dr. Roxana Hires, NP Lyda Jester, [place] [facility] [place], [place] Forestine Na, NP Audry Riles, PharmD   Please be sure to bring in all your medications bottles to every appointment.   If you have any questions or concerns before your next appointment please send [place] a message through [place] or call our office at [phone].    TO LEAVE A MESSAGE FOR THE NURSE  SELECT OPTION 2, PLEASE LEAVE A MESSAGE INCLUDING: YOUR NAME DATE OF BIRTH CALL BACK NUMBER REASON FOR CALL**this is important as we prioritize the call backs  YOU WILL RECEIVE A CALL BACK THE SAME DAY AS LONG AS YOU CALL BEFORE 4:00 PM

## 2022-05-13 ENCOUNTER — Telehealth (HOSPITAL_COMMUNITY): Payer: Self-pay | Admitting: *Deleted

## 2022-06-02 ENCOUNTER — Other Ambulatory Visit: Payer: Self-pay | Admitting: *Deleted

## 2022-06-02 DIAGNOSIS — I5032 Chronic diastolic (congestive) heart failure: Secondary | ICD-10-CM

## 2022-06-05 ENCOUNTER — Encounter (HOSPITAL_COMMUNITY): Payer: Self-pay | Admitting: Internal Medicine

## 2022-06-05 ENCOUNTER — Ambulatory Visit (HOSPITAL_COMMUNITY)
Admission: RE | Admit: 2022-06-05 | Discharge: 2022-06-05 | Disposition: A | Discharge status: Home / Self Care | Payer: BC Managed Care – PPO | Source: Ambulatory Visit | Attending: Internal Medicine | Admitting: Internal Medicine

## 2022-06-05 VITALS — BP 118/70 | HR 74 | Wt 170.2 lb

## 2022-06-05 DIAGNOSIS — E54 Ascorbic acid deficiency: Secondary | ICD-10-CM | POA: Diagnosis not present

## 2022-06-05 DIAGNOSIS — I340 Nonrheumatic mitral (valve) insufficiency: Secondary | ICD-10-CM

## 2022-06-05 DIAGNOSIS — I428 Other cardiomyopathies: Secondary | ICD-10-CM | POA: Insufficient documentation

## 2022-06-05 DIAGNOSIS — R001 Bradycardia, unspecified: Secondary | ICD-10-CM | POA: Insufficient documentation

## 2022-06-05 DIAGNOSIS — I341 Nonrheumatic mitral (valve) prolapse: Secondary | ICD-10-CM | POA: Diagnosis not present

## 2022-06-05 DIAGNOSIS — I5032 Chronic diastolic (congestive) heart failure: Secondary | ICD-10-CM | POA: Insufficient documentation

## 2022-06-05 DIAGNOSIS — E785 Hyperlipidemia, unspecified: Secondary | ICD-10-CM | POA: Insufficient documentation

## 2022-06-05 LAB — ECHOCARDIOGRAM COMPLETE
Area-P 1/2: 3.17 cm2
Calc EF: 52.6 %
MV M vel: 5.51 m/s
MV Peak grad: 121.2 mmHg
MV VTI: 2.41 cm2
Radius: 0.4 cm
S' Lateral: 2.9 cm
Single Plane A2C EF: 52.8 %
Single Plane A4C EF: 51 %

## 2022-06-05 NOTE — Patient Instructions (Signed)
Your physician recommends that you schedule a follow-up appointment in: 1 year with an echocardiogram, **PLEASE CALL OUR OFFICE IN OCTOBER 2024 TO SCHEDULE THESE APPOINTMENT  If you have any questions or concerns before your next appointment please send [place] a message through [place] or call our office at [phone].    TO LEAVE A MESSAGE FOR THE NURSE SELECT OPTION 2, PLEASE LEAVE A MESSAGE INCLUDING: YOUR NAME DATE OF BIRTH CALL BACK NUMBER REASON FOR CALL**this is important as we prioritize the call backs  YOU WILL RECEIVE A CALL BACK THE SAME DAY AS LONG AS YOU CALL BEFORE 4:00 PM

## 2022-06-05 NOTE — Progress Notes (Signed)
ADVANCED HF CLINIC NOTE  PCP: Maximiano Coss, NP Primary Cardiologist: Gwenlyn Found HF Cardiologist: Dr. Haroldine Laws  HPI:  Kevin Medina is a 51 year old male with HL, mitral valve prolapse and mitral regurgitation.   First noted to have MVP ~15 years ago when he underwent an echo due to palpitations. He was told at the time that the valve is not leaking much and he did not need surgical intervention. Developed COVID-19 infection in October 2020.  After his recovery he experienced significant exertional shortness of breath with initial attempt to return to exercise. Echo in 2021 EF 40%  with bileaflet MVP with at least moderate MR and EF 40%  TEE confirmed the presence of bileaflet prolapse with "moderate to severe" MR.   LVEF 40 to 45%. RV normal. L/R cath -> No CAD and normal right heart pressures.    Seen by Dr. Roxy Manns and scheduled for MVR on 11/22/19. However initial intra-op TEE showed minimal MR so surgery was cancelled. Subsequently had CPX test and stress echo. CPX showed excellent functional capacity with mildly elevated VEVCO2 suggestive of increased pulmonary pressures with exercise. Stress echo with mod MR at rest and exercise. Subsequently underwent exercise RHC (as below) and MVR deferred due to only mild increase in PA pressures with exercise.   Seen back in Clinic 7/22 with recurrent DOE and palpitations. Echo 8/22 EF 60-65% Barlow's type valve mild to mod MVR. LA dilated. RV ok.  Zio (7/22) with occasional SVT. Repeat CPX 8/22 with excellent functional capacity but Ve/VCO2 slope 41 suggestive of significant increase in pulmonary pressures/dead space with exercise.   S/p MVR 04/21/21 w/ Dr. Cheree Ditto at [facility].  Echo 6/23 EF 40-45% mild to moderate MR with MV ring. Started on Jardiance 10.  Today he returns for HF follow up with his wife. Feels good. Very active. If he sustains high HR for long time gets CP. Sometimes occurs during exercise and sometimes after. Functional capacity great.   Echo today EF  55-60% s/p MV repair. No MS. Mild MR   Assessment:   Normal resting hemodynamics. Hypertensive responsive to exercise. Mild increase in PA pressures and PCWP/v-waves with exercise. Would continue watchful waiting of his MR.  ROS: All systems reviewed and negative except as per HPI.   Past Medical History:  Diagnosis Date   Allergy    Arrhythmia    Exercise-induced asthma    Heart murmur    due to MVP    Hyperlipidemia    Mitral regurgitation    Mitral valve prolapse    Current Outpatient Medications  Medication Sig Dispense Refill   amoxicillin (AMOXIL) 500 MG capsule Take 4 capsules 1 hour prior to procedure. 4 capsule 0   aspirin 81 MG EC tablet Take by mouth.     fish oil-omega-3 fatty acids 1000 MG capsule Take 4,500 mg by mouth daily. Sometimes takes more than '4500mg'$  daily     Multiple Vitamin (MULTIVITAMIN) tablet Take 1 tablet by mouth daily.     psyllium (METAMUCIL) 58.6 % packet Take 1 packet by mouth daily.     No current facility-administered medications for this encounter.   Allergies  Allergen Reactions   Tape Itching and Rash   Social History   Socioeconomic History   Marital status: Married    Spouse name: Not on file   Number of children: 1   Years of education: Not on file   Highest education level: Not on file  Occupational History   Not on file  Tobacco Use  Smoking status: Former    Packs/day: 2.00    Years: 16.00    Total pack years: 32.00    Types: Cigarettes    Quit date: 06/15/1997    Years since quitting: 24.9   Smokeless tobacco: Former  Scientific laboratory technician Use: Never used  Substance and Sexual Activity   Alcohol use: Yes    Alcohol/week: 0.0 - 1.0 standard drinks of alcohol    Comment: social- none since jan 08-2019   Drug use: Never   Sexual activity: Yes  Other Topics Concern   Not on file  Social History Narrative   Not on file   Social Determinants of Health   Financial Resource Strain: Not on file  Food Insecurity: Not  on file  Transportation Needs: Not on file  Physical Activity: Not on file  Stress: Not on file  Social Connections: Not on file  Intimate Partner Violence: Not on file   Family History  Problem Relation Age of Onset   Healthy Mother    Breast cancer Mother    Hyperlipidemia Father    Hypertension Father    Healthy Sister    Lung cancer Maternal Grandmother        nonsmoker   Stroke Maternal Grandfather    Hyperlipidemia Paternal Grandfather    Colon polyps Maternal Uncle    Colon cancer Neg Hx    Esophageal cancer Neg Hx    Rectal cancer Neg Hx    Stomach cancer Neg Hx    BP 118/70   Pulse 74   Wt 77.2 kg (170 lb 3.2 oz)   SpO2 98%   BMI 28.32 kg/m   Wt Readings from Last 3 Encounters:  06/05/22 77.2 kg (170 lb 3.2 oz)  03/06/22 75.4 kg (166 lb 3.2 oz)  12/05/21 73.7 kg (162 lb 6.4 oz)   PHYSICAL EXAM: General:  Well appearing. No resp difficulty HEENT: normal Neck: supple. no JVD. Carotids 2+ bilat; no bruits. No lymphadenopathy or thryomegaly appreciated. Cor: PMI nondisplaced. Regular rate & rhythm. No rubs, gallops or murmurs. Lungs: clear Abdomen: soft, nontender, nondistended. No hepatosplenomegaly. No bruits or masses. Good bowel sounds. Extremities: no cyanosis, clubbing, rash, edema Neuro: alert & orientedx3, cranial nerves grossly intact. moves all 4 extremities w/o difficulty. Affect pleasant  NSR 77 No ST-T wave abnormalities. Personally reviewed    ASSESSMENT & PLAN:  1. Mitral regurgitation in setting of Barlow's disease - s/p mini-MVR 04/2021 with Dr. Cheree Ditto at [facility].  - Echo (12/05/21): EF 40-45% mild to moderate MR. - Echo 06/05/22: EF 55-60% mild MR - Echo looks good. MR stable - Aware of SBE prophylaxis - Suspect post-op CP is musculoskeletal  2. Non-ischemic CM - Echo 12/05/21 EF 40-45% mild to moderate MR - Echo 06/05/22: EF 55-60% mild MR - NYHA I. Volume looks good today. - BP has been soft but improved today.  3. Palpitations - Zio  (8/22) no high-grade arrhythmias. - Zio (7/22) with occasional SVT.  - Improved.   Glori Bickers, MD  06/05/22

## 2022-06-05 NOTE — Progress Notes (Signed)
Echocardiogram 2D Echocardiogram has been performed.  Kevin Medina 06/05/2022, 8:44 AM

## 2022-09-07 ENCOUNTER — Encounter: Payer: BC Managed Care – PPO | Admitting: Registered Nurse

## 2023-01-22 ENCOUNTER — Telehealth: Payer: Self-pay | Admitting: Registered Nurse

## 2023-01-22 NOTE — Telephone Encounter (Signed)
Please see message and advise 

## 2023-01-22 NOTE — Telephone Encounter (Signed)
Patient called requesting to establish with Dr. Durene Cal. Previous PCP was Janeece Agee who left the practice last year. States he was informed by Dr. Raynelle Fanning that Dr. Durene Cal said it was okay for him to establish care. Please Advise.

## 2023-01-24 NOTE — Telephone Encounter (Signed)
Yes thanks- just may take some time to get in with my upcoming paternity time off

## 2023-01-25 NOTE — Telephone Encounter (Signed)
LVM informing patient of approval. Offered patient next opening on 12/9 @ 3pm.

## 2023-01-25 NOTE — Telephone Encounter (Signed)
See below

## 2023-01-26 ENCOUNTER — Other Ambulatory Visit (HOSPITAL_COMMUNITY): Payer: Self-pay | Admitting: Cardiology

## 2023-01-26 MED ORDER — AMOXICILLIN 500 MG PO CAPS: ORAL_CAPSULE | ORAL | 0 refills | Status: AC

## 2023-01-26 NOTE — Telephone Encounter (Signed)
Patient left vm on triage line requesting refill of abx prior to upcoming dental appt.  -refills returned   Pt also wanted to know if its ok to donate blood. -will route to provider

## 2023-01-28 NOTE — Telephone Encounter (Signed)
   Amoxicillin 500 mg 4 capsules  1 hour prior to procedure.   Please fill.   Denae Zulueta NP-C  4:52 PM

## 2023-03-16 ENCOUNTER — Other Ambulatory Visit (HOSPITAL_COMMUNITY): Payer: Self-pay | Admitting: Cardiology

## 2023-03-16 DIAGNOSIS — I5032 Chronic diastolic (congestive) heart failure: Secondary | ICD-10-CM

## 2023-05-24 ENCOUNTER — Ambulatory Visit (INDEPENDENT_AMBULATORY_CARE_PROVIDER_SITE_OTHER): Payer: BC Managed Care – PPO | Admitting: Family Medicine

## 2023-05-24 ENCOUNTER — Encounter: Payer: Self-pay | Admitting: Family Medicine

## 2023-05-24 VITALS — BP 124/86 | HR 73 | Temp 98.2°F | Resp 16 | Ht 65.0 in | Wt 166.6 lb

## 2023-05-24 DIAGNOSIS — Z126 Encounter for screening for malignant neoplasm of bladder: Secondary | ICD-10-CM

## 2023-05-24 DIAGNOSIS — Z0000 Encounter for general adult medical examination without abnormal findings: Secondary | ICD-10-CM | POA: Diagnosis not present

## 2023-05-24 DIAGNOSIS — Z87891 Personal history of nicotine dependence: Secondary | ICD-10-CM

## 2023-05-24 DIAGNOSIS — I341 Nonrheumatic mitral (valve) prolapse: Secondary | ICD-10-CM

## 2023-05-24 DIAGNOSIS — Z114 Encounter for screening for human immunodeficiency virus [HIV]: Secondary | ICD-10-CM

## 2023-05-24 DIAGNOSIS — Z130 Encounter for screening for diseases of the blood and blood-forming organs and certain disorders involving the immune mechanism: Secondary | ICD-10-CM | POA: Diagnosis not present

## 2023-05-24 DIAGNOSIS — Z125 Encounter for screening for malignant neoplasm of prostate: Secondary | ICD-10-CM

## 2023-05-24 DIAGNOSIS — K429 Umbilical hernia without obstruction or gangrene: Secondary | ICD-10-CM

## 2023-05-24 DIAGNOSIS — Z13220 Encounter for screening for lipoid disorders: Secondary | ICD-10-CM

## 2023-05-24 NOTE — Progress Notes (Signed)
Phone: [phone]   Subjective:  Patient presents today for their annual physical. Chief complaint-noted.   See problem oriented charting- ROS- full  review of systems was completed and negative  except for: bulge at umbilicus- tender with lifting and notes pops out- able to get back in  The following were reviewed and entered/updated in epic: Past Medical History:  Diagnosis Date   Allergy    Arrhythmia    all pres surgery   Heart murmur    due to MVP    Hyperlipidemia    Mitral regurgitation    Mitral valve prolapse    Patient Active Problem List   Diagnosis Date Noted   Mitral regurgitation     Priority: High   Mitral valve prolapse 07/14/2019   Past Surgical History:  Procedure Laterality Date   COLONOSCOPY     MITRAL VALVE REPAIR  04/21/2021   POLYPECTOMY  15 yrs ago    ? type of polyps per pt    RIGHT HEART CATH N/A 02/02/2020   Procedure: RIGHT HEART CATH;  Surgeon: Dolores Patty, MD;  [facility]: [facility];  Service: Cardiovascular;  Laterality: N/A;   RIGHT/LEFT HEART CATH AND CORONARY ANGIOGRAPHY N/A 10/05/2019   Procedure: RIGHT/LEFT HEART CATH AND CORONARY ANGIOGRAPHY;  Surgeon: Runell Gess, MD;  Location: [facility];  Service: Cardiovascular;  Laterality: N/A;   TEE WITHOUT CARDIOVERSION N/A 10/05/2019   Procedure: TRANSESOPHAGEAL ECHOCARDIOGRAM (TEE);  Surgeon: Lewayne Bunting, MD;  Location: [facility] ENDOSCOPY;  Service: Cardiovascular;  Laterality: N/A;   TEE WITHOUT CARDIOVERSION N/A 11/22/2019   Procedure: TRANSESOPHAGEAL ECHOCARDIOGRAM (TEE);  Surgeon: Purcell Nails, MD;  Location: [facility] OR;  Service: Open Heart Surgery;  Laterality: N/A;   VASECTOMY      Family History  Problem Relation Age of Onset   Breast cancer Mother        survivor from 4s   Hyperlipidemia Father    Hypertension Father        passed at 13   Hernia Father        did not do well after surgery, was dealing with leukemia related state- had issues  after needing transfusion   Prostate cancer Father        in 46s   Healthy Sister    Lung cancer Maternal Grandmother        nonsmoker age 22   Stroke Maternal Grandfather        in 1s   Other Paternal Grandmother        unknown history   Hyperlipidemia Paternal Grandfather        possible heart issues   Healthy Daughter    Colon polyps Maternal Uncle        passed of old age in 28s   Colon cancer Neg Hx    Esophageal cancer Neg Hx    Rectal cancer Neg Hx    Stomach cancer Neg Hx     Medications- reviewed and updated Current Outpatient Medications  Medication Sig Dispense Refill   amoxicillin (AMOXIL) 500 MG capsule Take 4 capsules 1 hour prior to procedure. 4 capsule 0   aspirin 81 MG EC tablet Take by mouth.     fish oil-omega-3 fatty acids 1000 MG capsule Take 4,500 mg by mouth daily. Sometimes takes more than 4500mg  daily     Multiple Vitamin (MULTIVITAMIN) tablet Take 1 tablet by mouth daily.     psyllium (METAMUCIL) 58.6 % packet Take 1 packet by mouth daily.  No current facility-administered medications for this visit.    Allergies-reviewed and updated Allergies  Allergen Reactions   Tape Itching and Rash    Social History   Social History Narrative   Married. 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 Oropharynx normal Neck: no thyromegaly CV: RRR no murmurs rubs or gallops Lungs: CTAB no crackles, wheeze, rhonchi Abdomen: soft/nontender/nondistended/normal bowel sounds. No rebound or  guarding. Umbilical hernia noted- easily reducible Ext: no edema Skin: warm, dry Neuro: grossly normal, moves all extremities, PERRLA   Assessment and Plan  52 y.o. male presenting for annual physical.  Health Maintenance counseling: 1. Anticipatory guidance: Patient counseled regarding regular dental exams -q6 months, eye exams - every other yearly,  avoiding smoking and second hand smoke , limiting alcohol to 2 beverages per day - none since September 2022- personal choice, no illicit drugs .   2. Risk factor reduction:  Advised patient of need for regular exercise and diet rich and fruits and vegetables to reduce risk of heart attack and stroke.  Exercise- regular walking plus more intense 2 days a week oked per cardiology.  Diet/weight management-healthy weight- working on body fat percentage- was 13-14 % and up to 16-18%.  Wt Readings from Last 3 Encounters:  05/24/23 166 lb 9.6 oz (75.6 kg)  06/05/22 170 lb 3.2 oz (77.2 kg)  03/06/22 166 lb 3.2 oz (75.4 kg)  3. Immunizations/screenings/ancillary studies- has had 2nd shingrix [facility] [employer]. Holding off on flu and COVID shots (has done well other than first round of COVID)  -screen HIV since had transfusion in hospital Immunization History  Administered Date(s) Administered   Janssen (J&J) SARS-COV-2 Vaccination 08/26/2019   PFIZER SARS-COV-2 Pediatric Vaccination 5-32yrs 05/24/2020   Tdap 07/14/2019   Zoster Recombinant(Shingrix) 07/19/2020  4. Prostate cancer screening- dad with prostate cancer history - we opted to screen annually  Lab Results  Component Value Date   PSA 0.70 08/29/2021   5. Colon cancer screening - 10/31/2021 with 7 year repeat due to precancerous polyp 6. Skin cancer screening- no dermatologist. advised regular sunscreen use. Denies worrisome, changing, or new skin lesions.  7. Smoking associated screening (lung cancer screening, AAA screen 65-75, UA)- former smoker- quit 1999. 32 pack years- started age  2. Will not need abdominal aortic aneurysm- normal CT angio abdomen/pelvis on 11/20/19 8. STD screening - only active with wife  Status of chronic or acute concerns   #Umbilical hernia- bulge at umbilicus- tender with lifting and notes pops out- able to get back in -will refer to [facility] [place] [facility]   # Mitral valve prolapse and mitral regurgitation S: Issues date back at least 15 years but progressive issues after COVID in 2020-eventually led to the following from Dr. Prescott Gum note "Echo 8/22 EF 60-65% Barlow's type valve mild to mod MVR. LA dilated. RV ok.  Zio (7/22) with occasional SVT. Repeat CPX 8/22 with excellent functional  capacity but Ve/VCO2 slope 41 suggestive of significant increase in pulmonary pressures/dead space with exercise.    S/p MVR 04/21/21 w/ Dr. Zebedee Iba at [facility].   Echo 6/23 EF 40-45% mild to moderate MR with MV ring. Started on Jardiance 10." -later stopped jardiance as craved ice cream and gained weight -Most recent echocardiogram 06/06/2022 with 55 to 60% EF status post mitral valve repair with no mitral stenosis and mild mitral regurgitation  -Still has hypertensive response to exercise  -Requires SBE prophylaxis  He is walking daily around 3 miles  and allowed to do 2 hard workouts a week- either run or crossfit. If overdoes it notes fatigue- has to be careful with excess exertion. Will even have pain at times with exercise still but not always- cardiology aware A/P: overall doing well- close follow up with Dr. Gala Romney    #screen diabetes - done in past but we opted out   Recommended follow up: Return in about 1 year (around 05/23/2024) for physical or sooner if needed.Schedule b4 you leave. Future Appointments  Date Time Provider Department Center  06/04/2023  2:00 PM [facility] ECHO/CH OP [facility]  06/04/2023  3:00 PM [facility] PA/NP [facility] None   Lab/Order associations: fasting- black coffee and tea this morning   ICD-10-CM   1. Preventative health  care  Z00.00     2. Mitral valve prolapse  I34.1     3. Screening for HIV (human immunodeficiency virus)  Z11.4 HIV antibody (with reflex)    4. Screening for prostate cancer  Z12.5 PSA    5. Screening for hyperlipidemia  Z13.220 Comprehensive metabolic panel    Lipid panel    6. Screening for deficiency anemia  Z13.0 CBC with Differential/Platelet    7. Screening for bladder cancer  Z12.6 Urinalysis, Routine w reflex microscopic    8. Former smoker  Z87.891     9. Umbilical hernia without obstruction and without gangrene  K42.9       No orders of the defined types were placed in this encounter.   Return precautions advised.  Tana Conch, MD

## 2023-05-24 NOTE — Patient Instructions (Addendum)
Please stop by lab before you go If you have mychart- we will send your results within 3 business days of [place] receiving them.  If you do not have mychart- we will call you about results within 5 business days of [place] receiving them.  *please also note that you will see labs on mychart as soon as they post. I will later go in and write notes on them- will say "notes from Dr. Durene Cal"   Team [facility] [employer] and get date of 2nd shingrix- we only have one listed.   We have placed a referral for you today to [facility] [place] [facility]. In some cases you will see # listed below- you can call this if you have not heard within a week. If you do not see # listed- you should receive a mychart message or phone call within a week with the # to call directly- call that as soon as you get it. If you are having issues getting scheduled reach out to [place] again.   Recommended follow up: Return in about 1 year (around 05/23/2024) for physical or sooner if needed.Schedule b4 you leave.

## 2023-05-25 LAB — CBC WITH DIFFERENTIAL/PLATELET
Basophils Absolute: 0.1 10*3/uL (ref 0.0–0.1)
Basophils Relative: 1.3 % (ref 0.0–3.0)
Eosinophils Absolute: 0.1 10*3/uL (ref 0.0–0.7)
Eosinophils Relative: 1.1 % (ref 0.0–5.0)
HCT: 45.4 % (ref 39.0–52.0)
Hemoglobin: 15.3 g/dL (ref 13.0–17.0)
Lymphocytes Relative: 39.5 % (ref 12.0–46.0)
Lymphs Abs: 2.2 10*3/uL (ref 0.7–4.0)
MCHC: 33.6 g/dL (ref 30.0–36.0)
MCV: 94.5 fL (ref 78.0–100.0)
Monocytes Absolute: 0.4 10*3/uL (ref 0.1–1.0)
Monocytes Relative: 7.5 % (ref 3.0–12.0)
Neutro Abs: 2.9 10*3/uL (ref 1.4–7.7)
Neutrophils Relative %: 50.6 % (ref 43.0–77.0)
Platelets: 261 10*3/uL (ref 150.0–400.0)
RBC: 4.81 Mil/uL (ref 4.22–5.81)
RDW: 12.7 % (ref 11.5–15.5)
WBC: 5.7 10*3/uL (ref 4.0–10.5)

## 2023-05-25 LAB — COMPREHENSIVE METABOLIC PANEL
ALT: 33 U/L (ref 0–53)
AST: 26 U/L (ref 0–37)
Albumin: 4.6 g/dL (ref 3.5–5.2)
Alkaline Phosphatase: 42 U/L (ref 39–117)
BUN: 13 mg/dL (ref 6–23)
CO2: 30 meq/L (ref 19–32)
Calcium: 9.5 mg/dL (ref 8.4–10.5)
Chloride: 101 meq/L (ref 96–112)
Creatinine, Ser: 1.11 mg/dL (ref 0.40–1.50)
GFR: 75.58 mL/min (ref >60.00)
Glucose, Bld: 82 mg/dL (ref 70–99)
Potassium: 3.9 meq/L (ref 3.5–5.1)
Sodium: 141 meq/L (ref 135–145)
Total Bilirubin: 1 mg/dL (ref 0.2–1.2)
Total Protein: 6.6 g/dL (ref 6.0–8.3)

## 2023-05-25 LAB — URINALYSIS, ROUTINE W REFLEX MICROSCOPIC
Bilirubin Urine: NEGATIVE
Hgb urine dipstick: NEGATIVE
Ketones, ur: NEGATIVE
Leukocytes,Ua: NEGATIVE
Nitrite: NEGATIVE
RBC / HPF: NONE SEEN (ref 0–?)
Specific Gravity, Urine: 1.01 (ref 1.000–1.030)
Total Protein, Urine: NEGATIVE
Urine Glucose: NEGATIVE
Urobilinogen, UA: 0.2 (ref 0.0–1.0)
WBC, UA: NONE SEEN (ref 0–?)
pH: 7 (ref 5.0–8.0)

## 2023-05-25 LAB — LIPID PANEL
Cholesterol: 239 mg/dL — ABNORMAL HIGH (ref 0–200)
HDL: 53.2 mg/dL (ref >39.00)
LDL Cholesterol: 168 mg/dL — ABNORMAL HIGH (ref 0–99)
NonHDL: 185.56
Total CHOL/HDL Ratio: 4
Triglycerides: 86 mg/dL (ref 0.0–149.0)
VLDL: 17.2 mg/dL (ref 0.0–40.0)

## 2023-05-25 LAB — HIV ANTIBODY (ROUTINE TESTING W REFLEX): HIV 1&2 Ab, 4th Generation: NONREACTIVE

## 2023-05-25 LAB — PSA: PSA: 0.69 ng/mL (ref 0.10–4.00)

## 2023-06-02 NOTE — Progress Notes (Addendum)
ADVANCED HF CLINIC NOTE  PCP: Janeece Agee, NP Primary Cardiologist: Allyson Sabal HF Cardiologist: Dr. Gala Romney  CC: HF follow up  HPI:  Kevin Medina is a 52 y.o. male with HL, mitral valve prolapse and mitral regurgitation.   First noted to have MVP ~15 years ago when he underwent an echo due to palpitations. He was told at the time that the valve is not leaking much and he did not need surgical intervention. Developed COVID-19 infection in October 2020.  After his recovery he experienced significant exertional shortness of breath with initial attempt to return to exercise. Echo in 2021 EF 40%  with bileaflet MVP with at least moderate MR and EF 40%  TEE confirmed the presence of bileaflet prolapse with "moderate to severe" MR.   LVEF 40 to 45%. RV normal. L/R cath -> No CAD and normal right heart pressures.    Seen by Dr. Cornelius Moras and scheduled for MVR on 11/22/19. However initial intra-op TEE showed minimal MR so surgery was cancelled. Subsequently had CPX test and stress echo. CPX showed excellent functional capacity with mildly elevated VEVCO2 suggestive of increased pulmonary pressures with exercise. Stress echo with mod MR at rest and exercise. Subsequently underwent exercise RHC (as below) and MVR deferred due to only mild increase in PA pressures with exercise.   Seen back in Clinic 7/22 with recurrent DOE and palpitations. Echo 8/22 EF 60-65% Barlow's type valve mild to mod MVR. LA dilated. RV ok.  Zio (7/22) with occasional SVT. Repeat CPX 8/22 with excellent functional capacity but Ve/VCO2 slope 41 suggestive of significant increase in pulmonary pressures/dead space with exercise.   S/p MV Repair 04/21/21 w/ Dr. Zebedee Iba at [facility].  Echo 6/23 EF 40-45% mild to moderate MR with MV ring. Started on Jardiance 10.  Echo 12/23 EF 55-60% s/p MV repair. No MS. Mild MR  Today he returns for HF follow up. Overall feeling fine. Main complaint it CP during workouts. Has had to stop working out and now just  walking. This is distressing him. CP occurs midsternum and goes through to back with intense physical exertion.  Although pain has improved, still feels some chest aches. Denies increasing SOB, dizziness, palpitations, edema, or PND/Orthopnea. Appetite ok. No fever or chills. Weight at home 170 pounds. Taking all medications.  Echo today 06/04/23 showed EF 50%, mild MR. Reviewed images with Dr. Gala Romney  ROS: All systems reviewed and negative except as per HPI.   Past Medical History:  Diagnosis Date   Allergy    Arrhythmia    all pres surgery   Heart murmur    due to MVP    Hyperlipidemia    Mitral regurgitation    Mitral valve prolapse    Current Outpatient Medications  Medication Sig Dispense Refill   amoxicillin (AMOXIL) 500 MG capsule Take 4 capsules 1 hour prior to procedure. 4 capsule 0   aspirin 81 MG EC tablet Take by mouth.     fish oil-omega-3 fatty acids 1000 MG capsule Take 4,500 mg by mouth daily. Sometimes takes more than 4500mg  daily     Multiple Vitamin (MULTIVITAMIN) tablet Take 1 tablet by mouth daily.     psyllium (METAMUCIL) 58.6 % packet Take 1 packet by mouth daily.     No current facility-administered medications for this encounter.   Allergies  Allergen Reactions   Tape Itching and Rash   Social History   Socioeconomic History   Marital status: Married    Spouse name: Not on file  Number of children: 1   Years of education: Not on file   Highest education level: Not on file  Occupational History   Not on file  Tobacco Use   Smoking status: Former    Current packs/day: 0.00    Average packs/day: 2.0 packs/day for 16.0 years (32.0 ttl pk-yrs)    Types: Cigarettes    Start date: 06/15/1981    Quit date: 06/15/1997    Years since quitting: 25.9   Smokeless tobacco: Former  Building services engineer status: Never Used  Substance and Sexual Activity   Alcohol use: Yes    Alcohol/week: 0.0 - 1.0 standard drinks of alcohol    Comment: social- none since  Sept 2022   Drug use: Never   Sexual activity: Yes    Partners: Female    Comment: wife only  Other Topics Concern   Not on file  Social History Narrative   Married. 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[place]      Hobbies: exercise, time with family, enjoys being active   -busy with yardwork   Social Drivers of Health   Financial Resource Strain: Low Risk  (05/02/2021)   Received from [facility], [facility]   Overall Financial Resource Strain (CARDIA)    Difficulty of Paying Living Expenses: Not hard at all  Food Insecurity: No Food Insecurity (05/02/2021)   Received from [facility], [facility]   Hunger Vital Sign    Worried About Running Out of Food in the Last Year: Never true    Ran Out of Food in the Last Year: Never true  Transportation Needs: No Transportation Needs (05/02/2021)   Received from [facility], [facility]   [facility] - Transportation    In the past 12 months, has lack of transportation kept you from medical appointments or from getting medications?: No    Lack of Transportation (Non-Medical): No  Physical Activity: Sufficiently Active (05/24/2023)   Exercise Vital Sign    Days of Exercise per Week: 7 days    Minutes of Exercise per Session: 60 min  Stress: Not on file  Social Connections: Not on file  Intimate Partner Violence: Not on file   Family History  Problem Relation Age of Onset   Breast cancer Mother        survivor from 3s   Hyperlipidemia Father    Hypertension Father        passed at 79   Hernia Father        did not do well after surgery, was dealing with leukemia related state- had issues after needing transfusion   Prostate cancer Father        in 56s   Healthy Sister    Lung cancer Maternal Grandmother        nonsmoker age 68   Stroke Maternal  Grandfather        in 39s   Other Paternal Grandmother        unknown history   Hyperlipidemia Paternal Grandfather        possible heart issues   Healthy Daughter    Colon polyps Maternal Uncle        passed of old age in 66s   Colon cancer Neg Hx    Esophageal cancer Neg Hx    Rectal cancer Neg Hx  Stomach cancer Neg Hx    BP 134/82   Pulse 67   Wt 77.2 kg (170 lb 3.2 oz)   SpO2 97%   BMI 28.32 kg/m   Wt Readings from Last 3 Encounters:  06/04/23 77.2 kg (170 lb 3.2 oz)  05/24/23 75.6 kg (166 lb 9.6 oz)  06/05/22 77.2 kg (170 lb 3.2 oz)   PHYSICAL EXAM: General:  NAD. No resp difficulty HEENT: Normal Neck: Supple. No JVD. Carotids 2+ bilat; no bruits. No lymphadenopathy or thryomegaly appreciated. Cor: PMI nondisplaced. Regular rate & rhythm. No rubs, gallops or murmurs. Lungs: Clear Abdomen: Soft, nontender, nondistended. No hepatosplenomegaly. No bruits or masses. Good bowel sounds. Extremities: No cyanosis, clubbing, rash, edema Neuro: Alert & oriented x 3, cranial nerves grossly intact. Moves all 4 extremities w/o difficulty. Affect pleasant.  ECG (personally reviewed): NSR 68 bpm, No ST-T changes. Reviewed with Dr Gala Romney   ASSESSMENT & PLAN: 1. 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 Zebedee Iba at [facility].  - Echo (12/05/21): EF 40-45% mild to moderate MR. - Echo 06/05/22: EF 55-60% mild MR. - Echo today 06/04/23, EF 50%, mild MR. - Echo looks good. MR stable - Aware of SBE prophylaxis  2. Non-ischemic CM - Echo 12/05/21 EF 40-45% mild to moderate MR - Echo 06/05/22: EF 55-60% mild MR - Echo today 06/04/23 EF 50% - NYHA I. Volume looks good today. - BP stable. - Recent labs with PCP reviewed and are stable; K 3.9, SCr 1.11  3. CP - LHC 2021 with normal cors - ECG without acute SR-T changes - Suspect MSK, but will arrange ETT. Discussed with Dr. Gala Romney  4. Palpitations - Zio (8/22) no high-grade  arrhythmias. - Zio (7/22) with occasional SVT.  - Resolved  5. Pre-Operative cardiac risk stratification - Scheduled for hernia repair - Obtain treadmill test first as part of chest pain work up. If OK will give clearance for surgery. Discussed this with Rob today - OK to hold ASA 5-7 days before surgery  Follow up in 1 year with Dr. Gala Romney + echo.  Anderson Malta Timpson, FNP  06/04/23

## 2023-06-04 ENCOUNTER — Telehealth: Payer: Self-pay | Admitting: *Deleted

## 2023-06-04 ENCOUNTER — Encounter (HOSPITAL_COMMUNITY): Payer: Self-pay

## 2023-06-04 ENCOUNTER — Ambulatory Visit (HOSPITAL_BASED_OUTPATIENT_CLINIC_OR_DEPARTMENT_OTHER)
Admission: RE | Admit: 2023-06-04 | Discharge: 2023-06-04 | Disposition: A | Discharge status: Home / Self Care | Payer: BC Managed Care – PPO | Source: Ambulatory Visit | Attending: Internal Medicine | Admitting: Internal Medicine

## 2023-06-04 ENCOUNTER — Ambulatory Visit: Payer: Self-pay | Admitting: General Surgery

## 2023-06-04 ENCOUNTER — Ambulatory Visit (HOSPITAL_COMMUNITY)
Admission: RE | Admit: 2023-06-04 | Discharge: 2023-06-04 | Disposition: A | Discharge status: Home / Self Care | Payer: BC Managed Care – PPO | Source: Ambulatory Visit | Attending: Internal Medicine | Admitting: Internal Medicine

## 2023-06-04 VITALS — BP 134/82 | HR 67 | Wt 170.2 lb

## 2023-06-04 DIAGNOSIS — I5032 Chronic diastolic (congestive) heart failure: Secondary | ICD-10-CM | POA: Diagnosis not present

## 2023-06-04 DIAGNOSIS — I428 Other cardiomyopathies: Secondary | ICD-10-CM | POA: Insufficient documentation

## 2023-06-04 DIAGNOSIS — R002 Palpitations: Secondary | ICD-10-CM

## 2023-06-04 DIAGNOSIS — Z952 Presence of prosthetic heart valve: Secondary | ICD-10-CM

## 2023-06-04 DIAGNOSIS — I080 Rheumatic disorders of both mitral and aortic valves: Secondary | ICD-10-CM | POA: Diagnosis not present

## 2023-06-04 DIAGNOSIS — R079 Chest pain, unspecified: Secondary | ICD-10-CM

## 2023-06-04 DIAGNOSIS — I340 Nonrheumatic mitral (valve) insufficiency: Secondary | ICD-10-CM | POA: Diagnosis not present

## 2023-06-04 DIAGNOSIS — E785 Hyperlipidemia, unspecified: Secondary | ICD-10-CM | POA: Diagnosis not present

## 2023-06-04 DIAGNOSIS — Z01818 Encounter for other preprocedural examination: Secondary | ICD-10-CM

## 2023-06-04 DIAGNOSIS — I509 Heart failure, unspecified: Secondary | ICD-10-CM | POA: Insufficient documentation

## 2023-06-04 DIAGNOSIS — Z87891 Personal history of nicotine dependence: Secondary | ICD-10-CM | POA: Insufficient documentation

## 2023-06-04 DIAGNOSIS — K429 Umbilical hernia without obstruction or gangrene: Secondary | ICD-10-CM | POA: Diagnosis not present

## 2023-06-04 LAB — ECHOCARDIOGRAM COMPLETE
AR max vel: 2.94 cm2
AV Area VTI: 2.88 cm2
AV Area mean vel: 2.57 cm2
AV Mean grad: 2.5 mm[Hg]
AV Peak grad: 5.1 mm[Hg]
Ao pk vel: 1.13 m/s
Area-P 1/2: 3.6 cm2
Calc EF: 61.7 %
MV M vel: 5.12 m/s
MV Peak grad: 105 mm[Hg]
MV VTI: 2.44 cm2
S' Lateral: 3.3 cm
Single Plane A2C EF: 63.4 %
Single Plane A4C EF: 58.7 %

## 2023-06-04 NOTE — Patient Instructions (Addendum)
Thank you for coming in today  If you had labs drawn today, any labs that are abnormal the clinic will call you No news is good news  Medications: No changes   Follow up appointments:  Your physician recommends that you schedule a follow-up appointment in:  1 year with echocardiogram with With Dr. Luanna Cole will receive a reminder letter in the mail a few months in advance. If you don't receive a letter, please call our office to schedule the follow-up appointment.    Do the following things EVERYDAY: Weigh yourself in the morning before breakfast. Write it down and keep it in a log. Take your medicines as prescribed Eat low salt foods--Limit salt (sodium) to 2000 mg per day.  Stay as active as you can everyday Limit all fluids for the day to less than 2 liters   At the [facility], you and your health needs are our priority. As part of our continuing mission to provide you with exceptional heart care, we have created designated Provider Care Teams. These Care Teams include your primary Cardiologist (physician) and Advanced Practice Providers (APPs- Physician Assistants and Nurse Practitioners) who all work together to provide you with the care you need, when you need it.   You may see any of the following providers on your designated Care Team at your next follow up: Dr Arvilla Meres Dr Marca Ancona Dr. Marcos Eke, NP Robbie Lis, [place] [facility] [place], [place] Brynda Peon, NP Karle Plumber, PharmD   Please be sure to bring in all your medications bottles to every appointment.    Thank you for choosing [facility]  If you have any questions or concerns before your next appointment please send [place] a message through Donna or call our office at [phone].    TO LEAVE A MESSAGE FOR THE NURSE SELECT OPTION 2, PLEASE LEAVE A MESSAGE INCLUDING: YOUR NAME DATE OF BIRTH CALL BACK  NUMBER REASON FOR CALL**this is important as we prioritize the call backs  YOU WILL RECEIVE A CALL BACK THE SAME DAY AS LONG AS YOU CALL BEFORE 4:00 PM

## 2023-06-04 NOTE — Telephone Encounter (Signed)
   Name: Kevin Medina  DOB: [birth year]  MRN: [id number]  Primary Cardiologist: Nanetta Batty, MD  Chart reviewed as part of pre-operative protocol coverage. The patient has an upcoming visit scheduled with heart failure clinic 06/04/23 at which time clearance can be addressed in case there are any issues that would impact surgical recommendations.  Hernia surgery is not scheduled as below. I added preop FYI to appointment note so that provider is aware to address at time of outpatient visit.  Per office protocol the cardiology provider should forward their finalized clearance decision and recommendations regarding antiplatelet therapy to the requesting party below.    I will route this message as FYI to requesting party and remove this message from the preop box as separate preop APP input not needed at this time.   Please call with any questions.  Rip Harbour, NP  06/04/2023, 10:13 AM

## 2023-06-04 NOTE — Addendum Note (Signed)
Encounter addended by: Jacklynn Ganong, FNP on: 06/04/2023 3:38 PM  Actions taken: Clinical Note Signed

## 2023-06-04 NOTE — H&P (Signed)
Chief Complaint: Hernia     History of Present Illness: Kevin Medina is a 52 y.o. male who is seen today as an office consultation at the request of Dr. Durene Cal for evaluation of Hernia .    Patient is a 51 year old male, with history of mitral valve replacement, comes in secondary to an umbilical hernia.  He states this been there for multiple years.  He states that over the last several months he has noticed that he had more pain and discomfort to the area with weight lifting.  He states he is able to reduce it on his own.  He had no previous abdominal surgery.  Patient sees Dr. Augustina Mood after his heart valve replacement.     Review of Systems: A complete review of systems was obtained from the patient.  I have reviewed this information and discussed as appropriate with the patient.  See HPI as well for other ROS.  Review of Systems  Constitutional:  Negative for fever.  HENT:  Negative for congestion.   Eyes:  Negative for blurred vision.  Respiratory:  Negative for cough, shortness of breath and wheezing.   Cardiovascular:  Negative for chest pain and palpitations.  Gastrointestinal:  Negative for heartburn.  Genitourinary:  Negative for dysuria.  Musculoskeletal:  Negative for myalgias.  Skin:  Negative for rash.  Neurological:  Negative for dizziness and headaches.  Psychiatric/Behavioral:  Negative for depression and suicidal ideas.   All other systems reviewed and are negative.     Medical History: Past Medical History:  Diagnosis Date   Dyslipidemia    Exercise-induced asthma (HHS-HCC)    MVP (mitral valve prolapse)    Non-rheumatic mitral regurgitation     Patient Active Problem List  Diagnosis   Mitral valve prolapse   Exercise-induced asthma (HHS-HCC)   Hyperlipidemia   Mitral regurgitation   Status post mitral valve repair   Acute postoperative pain   Postoperative anemia   Leukocytosis   S/P MVR (mitral valve repair)    Past Surgical  History:  Procedure Laterality Date   EXPLORATION CHEST FOR POSTOPERATIVE THROMBOSIS/INFECTION THORACOTOMY Right 04/21/2021   Procedure: EXPLORATION CHEST FOR POSTOPERATIVE HEMORRHAGE, THROMBOSIS OR INFECTION THORACOTOMY;  Surgeon: Collene Schlichter, MD;  Location: [facility] OPERATING ROOMS;  Service: Cardiothoracic;  Laterality: Right;   REPAIR MITRAL VALVE VIA HEARTPORT N/A 04/21/2021   Procedure: ADULT, VALVULOPLASTY, MITRAL VALVE, VIA HEARTPORT, WITH CARDIOPULMONARY BYPASS; RADICALRECONSTRUCTION, WITH OR WITHOUT RING;  Surgeon: Collene Schlichter, MD;  Location: [facility];  Service: Cardiothoracic;  Laterality: N/A;   REPLACEMENT MITRAL VALVE VIA HEART PORT N/A 04/21/2021   Procedure: ADULT, REPLACEMENT, MITRAL VALVE, VIA HEARTPORT, WITH CARDIOPULMONARY BYPASS, possible;  Surgeon: Collene Schlichter, MD;  Location: [facility];  Service: Cardiothoracic;  Laterality: N/A;     Allergies  Allergen Reactions   Adhesive Tape-Silicones Itching and Rash    Current Outpatient Medications on File Prior to Visit  Medication Sig Dispense Refill   aspirin 81 MG EC tablet Take 81 mg by mouth once daily     acetaminophen (TYLENOL) 650 MG ER tablet Take 2 tablets (1,300 mg total) by mouth every 8 (eight) hours as needed for Pain     metoprolol tartrate (LOPRESSOR) 25 MG tablet Take 0.5 tablets (12.5 mg total) by mouth every 12 (twelve) hours 30 tablet 2   polyethylene glycol (MIRALAX) packet Take 1 packet (17 g total) by mouth once daily as needed for Constipation Mix in 4-8ounces of fluid prior to taking.  0   rosuvastatin (CRESTOR) 10 MG tablet Take 1 tablet (10 mg total) by mouth once daily (Patient not taking: Reported on 05/12/2021) 30 tablet 2   sennosides-docusate (SENOKOT-S) 8.6-50 mg tablet Take 2 tablets by mouth 2 (two) times daily (Patient taking differently: Take 2 tablets by mouth as needed)  0   No current facility-administered medications on file prior to visit.    Family  History  Problem Relation Age of Onset   Breast cancer Mother    Lung cancer Maternal Grandmother    Stroke Maternal Grandfather      Social History   Tobacco Use  Smoking Status Former   Current packs/day: 0.00   Average packs/day: 2.0 packs/day for 16.0 years (32.0 ttl pk-yrs)   Types: Cigarettes   Start date: 59   Quit date: 1999   Years since quitting: 25.9  Smokeless Tobacco Never     Social History   Socioeconomic History   Marital status: Married  Tobacco Use   Smoking status: Former    Current packs/day: 0.00    Average packs/day: 2.0 packs/day for 16.0 years (32.0 ttl pk-yrs)    Types: Cigarettes    Start date: 75    Quit date: 1999    Years since quitting: 25.9   Smokeless tobacco: Never  Vaping Use   Vaping status: Never Used  Substance and Sexual Activity   Alcohol use: Not Currently   Social Drivers of Health   Financial Resource Strain: Low Risk  (05/02/2021)   Overall Financial Resource Strain (CARDIA)    Difficulty of Paying Living Expenses: Not hard at all  Food Insecurity: No Food Insecurity (05/02/2021)   Hunger Vital Sign    Worried About Running Out of Food in the Last Year: Never true    Ran Out of Food in the Last Year: Never true  Transportation Needs: No Transportation Needs (05/02/2021)   PRAPARE - Administrator, Civil Service (Medical): No    Lack of Transportation (Non-Medical): No  Physical Activity: Sufficiently Active (05/24/2023)   Received from [facility]   Exercise Vital Sign    Days of Exercise per Week: 7 days    Minutes of Exercise per Session: 60 min    Objective:    Vitals:   06/04/23 0909  BP: 118/80  Pulse: 93  Temp: 36.7 C (98 F)  SpO2: 99%  Weight: 77.9 kg (171 lb 12.8 oz)  Height: 165.1 cm (5\' 5" )  PainSc: 1   PainLoc: Abdomen    Body mass index is 28.59 kg/m.  Physical Exam Constitutional:      General: He is not in acute distress.    Appearance: Normal appearance.  HENT:      Head: Normocephalic.     Nose: No rhinorrhea.     Mouth/Throat:     Mouth: Mucous membranes are moist.     Pharynx: Oropharynx is clear.  Eyes:     General: No scleral icterus.    Pupils: Pupils are equal, round, and reactive to light.  Cardiovascular:     Rate and Rhythm: Normal rate.     Pulses: Normal pulses.  Pulmonary:     Effort: Pulmonary effort is normal. No respiratory distress.     Breath sounds: No stridor. No wheezing.  Abdominal:     General: Abdomen is flat. There is no distension.     Tenderness: There is no abdominal tenderness. There is no guarding or rebound.     Hernia: A hernia  is present. Hernia is present in the umbilical area.  Musculoskeletal:        General: Normal range of motion.     Cervical back: Normal range of motion and neck supple.  Skin:    General: Skin is warm and dry.     Capillary Refill: Capillary refill takes less than 2 seconds.     Coloration: Skin is not jaundiced.  Neurological:     General: No focal deficit present.     Mental Status: He is alert and oriented to person, place, and time. Mental status is at baseline.  Psychiatric:        Mood and Affect: Mood normal.        Thought Content: Thought content normal.        Judgment: Judgment normal.      Hernia Size: 2 cm Incarcerated: No Initial Hernia   Assessment and Plan:  Diagnoses and all orders for this visit:  Umbilical hernia without obstruction or gangrene    Kevin Medina is a 52 y.o. male    We will proceed to the OR for a lap ventral hernia repair with mesh. All risks and benefits were discussed with the patient, to generally include infection, bleeding, damage to surrounding structures, acute and chronic nerve pain, and recurrence. Alternatives were offered and described.  All questions were answered and the patient voiced understanding of the procedure and wishes to proceed at this point.       No follow-ups on file.  Axel Filler, MD, [facility], [place] [facility]

## 2023-06-04 NOTE — Telephone Encounter (Signed)
   Pre-operative Risk Assessment    Patient Name: Kevin Medina  DOB: 13-Apr-1971 MRN: [id number]   PT SCHEDULED FOR HEART FAILURE CLINIC TODAY, APPOINTMENT NOTES ALREADY UPDATED FOR APPT    Request for Surgical Clearance    Procedure:   HERNIA SURGERY  Date of Surgery:  Clearance TBD                                 Surgeon:  Axel Filler, MD Surgeon's Group or Practice Name:  [facility] Phone number:  [phone] Fax number:  [phone]   Type of Clearance Requested:   - Medical  - Pharmacy:  Hold Aspirin NOT INDICATED HOW LONG   Type of Anesthesia:  General    Additional requests/questions:    Wilhemina Cash   06/04/2023, 9:45 AM

## 2023-08-05 NOTE — Pre-Procedure Instructions (Signed)
 Surgical Instructions   Your procedure is scheduled on Monday, February 24th. Report to [facility] Main Entrance "A" at 10:30 A.M., then check in with the Admitting office. Any questions or running late day of surgery: call [phone]  Questions prior to your surgery date: call [phone], Monday-Friday, 8am-4pm. If you experience any cold or flu symptoms such as cough, fever, chills, shortness of breath, etc. between now and your scheduled surgery, please notify us at the above number.     Remember:  Do not eat after midnight the night before your surgery   You may drink clear liquids until 09:30 AM the morning of your surgery.   Clear liquids allowed are: Water, Non-Citrus Juices (without pulp), Carbonated Beverages, Clear Tea (no milk, honey, etc.), Black Coffee Only (NO MILK, CREAM OR POWDERED CREAMER of any kind), and Gatorade.    Take these medicines the morning of surgery with A SIP OF WATER: NONE    Follow your surgeon's instructions on when to stop Aspirin.  If no instructions were given by your surgeon then you will need to call the office to get those instructions.    One week prior to surgery, STOP taking any Aleve, Naproxen, Ibuprofen, Motrin, Advil, Goody's, BC's, all herbal medications, fish oil, and non-prescription vitamins.                     Do NOT Smoke (Tobacco/Vaping) for 24 hours prior to your procedure.  If you use a CPAP at night, you may bring your mask/headgear for your overnight stay.   You will be asked to remove any contacts, glasses, piercing's, hearing aid's, dentures/partials prior to surgery. Please bring cases for these items if needed.    Patients discharged the day of surgery will not be allowed to drive home, and someone needs to stay with them for 24 hours.  SURGICAL WAITING ROOM VISITATION Patients may have no more than 2 support people in the waiting area - these visitors may rotate.   Pre-op nurse will coordinate an appropriate time  for 1 ADULT support person, who may not rotate, to accompany patient in pre-op.  Children under the age of 62 must have an adult with them who is not the patient and must remain in the main waiting area with an adult.  If the patient needs to stay at the hospital during part of their recovery, the visitor guidelines for inpatient rooms apply.  Please refer to the [facility] website for the visitor guidelines for any additional information.   If you received a COVID test during your pre-op visit  it is requested that you wear a mask when out in public, stay away from anyone that may not be feeling well and notify your surgeon if you develop symptoms. If you have been in contact with anyone that has tested positive in the last 10 days please notify you surgeon.      Pre-operative CHG Bathing Instructions   You can play a key role in reducing the risk of infection after surgery. Your skin needs to be as free of germs as possible. You can reduce the number of germs on your skin by washing with CHG (chlorhexidine gluconate) soap before surgery. CHG is an antiseptic soap that kills germs and continues to kill germs even after washing.   DO NOT use if you have an allergy to chlorhexidine/CHG or antibacterial soaps. If your skin becomes reddened or irritated, stop using the CHG and notify one of our RNs  at [phone].              TAKE A SHOWER THE NIGHT BEFORE SURGERY AND THE DAY OF SURGERY    Please keep in mind the following:  DO NOT shave, including legs and underarms, 48 hours prior to surgery.   You may shave your face before/day of surgery.  Place clean sheets on your bed the night before surgery Use a clean washcloth (not used since being washed) for each shower. DO NOT sleep with pet's night before surgery.  CHG Shower Instructions:  Wash your face and private area with normal soap. If you choose to wash your hair, wash first with your normal shampoo.  After you use shampoo/soap,  rinse your hair and body thoroughly to remove shampoo/soap residue.  Turn the water OFF and apply half the bottle of CHG soap to a CLEAN washcloth.  Apply CHG soap ONLY FROM YOUR NECK DOWN TO YOUR TOES (washing for 3-5 minutes)  DO NOT use CHG soap on face, private areas, open wounds, or sores.  Pay special attention to the area where your surgery is being performed.  If you are having back surgery, having someone wash your back for you may be helpful. Wait 2 minutes after CHG soap is applied, then you may rinse off the CHG soap.  Pat dry with a clean towel  Put on clean pajamas    Additional instructions for the day of surgery: DO NOT APPLY any lotions, deodorants, cologne, or perfumes.   Do not wear jewelry or makeup Do not wear nail polish, gel polish, artificial nails, or any other type of covering on natural nails (fingers and toes) Do not bring valuables to the hospital. [facility] is not responsible for valuables/personal belongings. Put on clean/comfortable clothes.  Please brush your teeth.  Ask your nurse before applying any prescription medications to the skin.

## 2023-08-06 ENCOUNTER — Encounter (HOSPITAL_COMMUNITY)
Admission: RE | Admit: 2023-08-06 | Discharge: 2023-08-06 | Disposition: A | Discharge status: Home / Self Care | Payer: 59 | Source: Ambulatory Visit | Attending: General Surgery | Admitting: General Surgery

## 2023-08-06 ENCOUNTER — Encounter (HOSPITAL_COMMUNITY): Payer: Self-pay

## 2023-08-06 ENCOUNTER — Other Ambulatory Visit: Payer: Self-pay

## 2023-08-06 VITALS — BP 130/81 | HR 74 | Temp 98.2°F | Resp 18 | Ht 65.0 in | Wt 167.0 lb

## 2023-08-06 DIAGNOSIS — E785 Hyperlipidemia, unspecified: Secondary | ICD-10-CM | POA: Insufficient documentation

## 2023-08-06 DIAGNOSIS — Z01812 Encounter for preprocedural laboratory examination: Secondary | ICD-10-CM | POA: Insufficient documentation

## 2023-08-06 DIAGNOSIS — I080 Rheumatic disorders of both mitral and aortic valves: Secondary | ICD-10-CM | POA: Diagnosis not present

## 2023-08-06 DIAGNOSIS — Z87891 Personal history of nicotine dependence: Secondary | ICD-10-CM | POA: Diagnosis not present

## 2023-08-06 DIAGNOSIS — Z01818 Encounter for other preprocedural examination: Secondary | ICD-10-CM

## 2023-08-06 DIAGNOSIS — I341 Nonrheumatic mitral (valve) prolapse: Secondary | ICD-10-CM

## 2023-08-06 DIAGNOSIS — K429 Umbilical hernia without obstruction or gangrene: Secondary | ICD-10-CM | POA: Insufficient documentation

## 2023-08-06 LAB — BASIC METABOLIC PANEL
Anion gap: 6 (ref 5–15)
BUN: 16 mg/dL (ref 6–20)
CO2: 29 mmol/L (ref 22–32)
Calcium: 9.3 mg/dL (ref 8.9–10.3)
Chloride: 105 mmol/L (ref 98–111)
Creatinine, Ser: 1.1 mg/dL (ref 0.61–1.24)
GFR, Estimated: >60 mL/min (ref >60)
Glucose, Bld: 77 mg/dL (ref 70–99)
Potassium: 4.7 mmol/L (ref 3.5–5.1)
Sodium: 140 mmol/L (ref 135–145)

## 2023-08-06 LAB — CBC
HCT: 45.9 % (ref 39.0–52.0)
Hemoglobin: 16.2 g/dL (ref 13.0–17.0)
MCH: 31.7 pg (ref 26.0–34.0)
MCHC: 35.3 g/dL (ref 30.0–36.0)
MCV: 89.8 fL (ref 80.0–100.0)
Platelets: 242 10*3/uL (ref 150–400)
RBC: 5.11 MIL/uL (ref 4.22–5.81)
RDW: 12.2 % (ref 11.5–15.5)
WBC: 6.7 10*3/uL (ref 4.0–10.5)
nRBC: 0 % (ref 0.0–0.2)

## 2023-08-06 NOTE — Anesthesia Preprocedure Evaluation (Addendum)
 Anesthesia Evaluation  Patient identified by MRN, date of birth, ID band Patient awake    Reviewed: Allergy & Precautions, NPO status , Patient's Chart, lab work & pertinent test results  History of Anesthesia Complications Negative for: history of anesthetic complications  Airway Mallampati: II  TM Distance: >3 FB Neck ROM: Full   Comment: Previous grade II view with MAC 4 Dental  (+) Dental Advisory Given   Pulmonary neg shortness of breath, neg sleep apnea, neg COPD, neg recent URI, former smoker   Pulmonary exam normal breath sounds clear to auscultation       Cardiovascular (-) hypertension(-) angina (-) Past MI, (-) Cardiac Stents and (-) CABG + dysrhythmias + Valvular Problems/Murmurs MR and MVP  Rhythm:Regular Rate:Normal  HLD  TTE 06/04/2023: IMPRESSIONS    1. Left ventricular ejection fraction, by estimation, is 55 to 60%. The  left ventricle has normal function. The left ventricle has no regional  wall motion abnormalities. Left ventricular diastolic parameters are  indeterminate. The average left  ventricular global longitudinal strain is -17.5 %. The global longitudinal  strain is normal.   2. Right ventricular systolic function is normal. The right ventricular  size is normal. There is normal pulmonary artery systolic pressure.   3. Prior repair with annuloplasty ring Mild residual central MR . The  mitral valve has been repaired/replaced. Mild mitral valve regurgitation.  No evidence of mitral stenosis. There is a prosthetic annuloplasty ring  present in the mitral position.   4. The aortic valve is tricuspid. There is mild calcification of the  aortic valve. There is mild thickening of the aortic valve. Aortic valve  regurgitation is trivial. Aortic valve sclerosis is present, with no  evidence of aortic valve stenosis.   5. The inferior vena cava is normal in size with greater than 50%  respiratory  variability, suggesting right atrial pressure of 3 mmHg.     Neuro/Psych negative neurological ROS     GI/Hepatic negative GI ROS, Neg liver ROS,,,  Endo/Other  negative endocrine ROS    Renal/GU negative Renal ROS     Musculoskeletal   Abdominal   Peds  Hematology Lab Results      Component                Value               Date                      WBC                      6.7                 08/06/2023                HGB                      16.2                08/06/2023                HCT                      45.9                08/06/2023                MCV  89.8                08/06/2023                PLT                      242                 08/06/2023              Anesthesia Other Findings   Reproductive/Obstetrics                             Anesthesia Physical Anesthesia Plan  ASA: 2  Anesthesia Plan: General   Post-op Pain Management: Tylenol PO (pre-op)*   Induction: Intravenous  PONV Risk Score and Plan: 2 and Ondansetron, Dexamethasone and Treatment may vary due to age or medical condition  Airway Management Planned: Oral ETT  Additional Equipment:   Intra-op Plan:   Post-operative Plan: Extubation in OR  Informed Consent: I have reviewed the patients History and Physical, chart, labs and discussed the procedure including the risks, benefits and alternatives for the proposed anesthesia with the patient or authorized representative who has indicated his/her understanding and acceptance.     Dental advisory given  Plan Discussed with: CRNA and Anesthesiologist  Anesthesia Plan Comments: (See PAT note written 08/06/2023 by Shonna Chock, PA-C.  Risks of general anesthesia discussed including, but not limited to, sore throat, hoarse voice, chipped/damaged teeth, injury to vocal cords, nausea and vomiting, allergic reactions, lung infection, heart attack, stroke, and death. All questions  answered.  )       Anesthesia Quick Evaluation

## 2023-08-06 NOTE — Progress Notes (Signed)
 Anesthesia Chart Review:  Case: [id number] Date/Time: 08/09/23 1215   Procedure: LAPAROSCOPIC UMBILICAL HERNIA REPAIR WITH MESH   Anesthesia type: General   Pre-op diagnosis: umbilical hernia   [facility] ROOM 02 / [facility] OR   Surgeons: Axel Filler, MD       DISCUSSION: Patient is a 53 year old male scheduled for the above procedure. Evaluated by Dr. Derrell Lolling on 06/04/23. Patient reported umbilical hernia present for years but was becoming more painful with weight lifting. He was able to reduce it.  History includes former smoker (quit 06/15/97), murmur/MVP with severe MR (s/p mitral valvuloplasty with 40 mm Simulus semi rigid band, Goretex artificial chords to P2 04/20/21 by Dr. Clent Jacks, MD, s/p right thoracotomy for repair or intercostal vessel bleeding and clot evacuation 04/21/21), arrhythmia/palpitations (frequent PACs, SVT runs 12/2020 Zio, improved post MV repair), HLD.  Last follow-up at that [facility] Clinic was on 06/04/23 with Prince Rome, FNP. Echo that day showed LVEF 55-60%, no regional wall motion abnormalities, normal RV systolic function, normal PASP, MV with annuloplasty ring with mild residual central MR, no MS, trivial AR. He reported some chest pain that goes through to back during his workouts with some improvement but with some aches. No increasing SOB, dizziness, palpitations, edema. Normal coronaries in 2021. NP discussed with Dr. Gala Romney. Pain thought likely musculoskeletal, but did discuss ETT. Otherwise plan for one year follow-up with echo. In the interim, Mr. Fluegel reached back out to Dr. Gala Romney. Due to his hernia, he was not able to run for an ETT. Dr. Gala Romney felt it was okay to hold off on ETT for now and wished him well with surgery (see 07/05/23 Patient Message communication under Encounters).   EKG NSR on 06/04/23. He denied chest pain and SOB at PAT RN visit. BMP and CBC normal on 08/06/23. Anesthesia team to evaluate on the day of surgery. Last ASA  reported as 08/01/23.    VS: BP 130/81   Pulse 74   Temp 36.8 C   Resp 18   Ht 5\' 5"  (1.651 m)   Wt 75.8 kg   SpO2 98%   BMI 27.79 kg/m    PROVIDERS: Shelva Majestic, MD is PCP  Arvilla Meres, MD is cardiologist   LABS: Labs reviewed: Acceptable for surgery. (all labs ordered are listed, but only abnormal results are displayed)  Labs Reviewed  BASIC METABOLIC PANEL  CBC    EKG: 06/04/23: NSR   CV: Echo 06/04/23: IMPRESSIONS   1. Left ventricular ejection fraction, by estimation, is 55 to 60%. The  left ventricle has normal function. The left ventricle has no regional  wall motion abnormalities. Left ventricular diastolic parameters are  indeterminate. The average left  ventricular global longitudinal strain is -17.5 %. The global longitudinal  strain is normal.   2. Right ventricular systolic function is normal. The right ventricular  size is normal. There is normal pulmonary artery systolic pressure.   3. Prior repair with annuloplasty ring Mild residual central MR . The  mitral valve has been repaired/replaced. Mild mitral valve regurgitation.  No evidence of mitral stenosis. There is a prosthetic annuloplasty ring  present in the mitral position.   4. The aortic valve is tricuspid. There is mild calcification of the  aortic valve. There is mild thickening of the aortic valve. Aortic valve  regurgitation is trivial. Aortic valve sclerosis is present, with no  evidence of aortic valve stenosis.   5. The inferior vena cava is normal in  size with greater than 50%  respiratory variability, suggesting right atrial pressure of 3 mmHg.  - Comparison LVEF 55-60% 06/05/22; EF 40-45% 12/05/21 , EF 54% 04/24/21 post-MV repair; EF 60-65% 01/30/21 pre MV repair   RHC 02/02/20 (pre-MV Repair): Normal resting hemodynamics. Hypertensive responsive to exercise. Mild increase in PA pressures and PCWP/v-waves with exercise. Would continue watchful waiting of his MR.      Long term Zio monitor 01/03/21 - 01/16/21 (pre-MV Repair): 1. Sinus rhythm -  avg HR of 72 bpm.  2. Two runs of SVT -, the run with the fastest interval lasting 11 mins 3 secs with a max rate of 231 bpm (avg 151 bpm); the run with the fastest interval was also the longest.  3. Frequent PACs (10.5%, 161096) 4. Rare PVCs (< 1.0%) 5. Most patient-triggered events associated with isolated PACs.   US Carotid 11/20/19: Summary:  Right Carotid: Velocities in the right ICA are consistent with a 1-39%  stenosis.  Left Carotid: Velocities in the left ICA are consistent with a 1-39%  stenosis.    LHC 10/05/19 (pre-MV Repair): Mr. Hallum and has normal coronary arteries and normal filling pressures.  He did have a transesophageal echo performed earlier this morning revealing severe bileaflet prolapse and moderate to severe MR  with moderate LV dysfunction.    Past Medical History:  Diagnosis Date   Allergy    Arrhythmia    all pres surgery   Heart murmur    due to MVP    Hyperlipidemia    Mitral regurgitation    Mitral valve prolapse     Past Surgical History:  Procedure Laterality Date   COLONOSCOPY     MITRAL VALVE REPAIR  04/21/2021   POLYPECTOMY  15 yrs ago    ? type of polyps per pt    RIGHT HEART CATH N/A 02/02/2020   Procedure: RIGHT HEART CATH;  Surgeon: Dolores Patty, MD;  Location: [facility] INVASIVE CV LAB;  Service: Cardiovascular;  Laterality: N/A;   RIGHT/LEFT HEART CATH AND CORONARY ANGIOGRAPHY N/A 10/05/2019   Procedure: RIGHT/LEFT HEART CATH AND CORONARY ANGIOGRAPHY;  Surgeon: Runell Gess, MD;  Location: [facility] INVASIVE CV LAB;  Service: Cardiovascular;  Laterality: N/A;   TEE WITHOUT CARDIOVERSION N/A 10/05/2019   Procedure: TRANSESOPHAGEAL ECHOCARDIOGRAM (TEE);  Surgeon: Lewayne Bunting, MD;  Location: [facility] ENDOSCOPY;  Service: Cardiovascular;  Laterality: N/A;   TEE WITHOUT CARDIOVERSION N/A 11/22/2019   Procedure: TRANSESOPHAGEAL ECHOCARDIOGRAM (TEE);  Surgeon:  Purcell Nails, MD;  Location: [facility] OR;  Service: Open Heart Surgery;  Laterality: N/A;   VASECTOMY      MEDICATIONS:  amoxicillin (AMOXIL) 500 MG capsule   aspirin 81 MG EC tablet   b complex vitamins capsule   Cholecalciferol (VITAMIN D3) 50 MCG (2000 UT) capsule   Collagen-Vitamin C-Biotin (COLLAGEN PO)   Multiple Vitamin (MULTIVITAMIN) tablet   Omega-3 Fatty Acids (FISH OIL PO)   Psyllium Husk POWD   No current facility-administered medications for this encounter.  Amoxicillin is pre-dental.    Shonna Chock, PA-C Surgical Short Stay/Anesthesiology [facility] Phone [phone] [facility] Phone [phone] 08/06/2023 3:17 PM

## 2023-08-06 NOTE — Progress Notes (Signed)
 PCP - Dr. Tana Conch Cardiologist - Dr. Gala Romney  PPM/ICD - denies   Chest x-ray - 05/12/21- CE EKG - 05/2023 Stress Test - 01/30/21 ECHO - 06/04/23 Cardiac Cath - 02/02/20  Sleep Study - denies   DM- denies  Last dose of GLP1 agonist-  n/a   Blood Thinner Instructions: n/a Aspirin Instructions: hold 1 week. Last dose 2/16  ERAS Protcol - yes, no drink   COVID TEST- n/a   Anesthesia review: yes, cardiac hx  Patient denies shortness of breath, fever, cough and chest pain at PAT appointment   All instructions explained to the patient, with a verbal understanding of the material. Patient agrees to go over the instructions while at home for a better understanding.  The opportunity to ask questions was provided.

## 2023-08-09 ENCOUNTER — Other Ambulatory Visit: Payer: Self-pay

## 2023-08-09 ENCOUNTER — Ambulatory Visit (HOSPITAL_COMMUNITY): Payer: 59 | Admitting: Physician Assistant

## 2023-08-09 ENCOUNTER — Encounter (HOSPITAL_COMMUNITY): Payer: Self-pay | Admitting: General Surgery

## 2023-08-09 ENCOUNTER — Ambulatory Visit (HOSPITAL_BASED_OUTPATIENT_CLINIC_OR_DEPARTMENT_OTHER): Payer: Self-pay | Admitting: Anesthesiology

## 2023-08-09 ENCOUNTER — Encounter (HOSPITAL_COMMUNITY)
Admission: RE | Disposition: A | Discharge status: Home / Self Care | Payer: Self-pay | Source: Home / Self Care | Attending: General Surgery

## 2023-08-09 ENCOUNTER — Ambulatory Visit (HOSPITAL_COMMUNITY)
Admission: RE | Admit: 2023-08-09 | Discharge: 2023-08-09 | Disposition: A | Discharge status: Home / Self Care | Payer: 59 | Attending: General Surgery | Admitting: General Surgery

## 2023-08-09 DIAGNOSIS — K429 Umbilical hernia without obstruction or gangrene: Secondary | ICD-10-CM | POA: Insufficient documentation

## 2023-08-09 DIAGNOSIS — Z7982 Long term (current) use of aspirin: Secondary | ICD-10-CM | POA: Insufficient documentation

## 2023-08-09 DIAGNOSIS — Z87891 Personal history of nicotine dependence: Secondary | ICD-10-CM | POA: Insufficient documentation

## 2023-08-09 DIAGNOSIS — Z79899 Other long term (current) drug therapy: Secondary | ICD-10-CM | POA: Insufficient documentation

## 2023-08-09 DIAGNOSIS — Z952 Presence of prosthetic heart valve: Secondary | ICD-10-CM | POA: Insufficient documentation

## 2023-08-09 DIAGNOSIS — I358 Other nonrheumatic aortic valve disorders: Secondary | ICD-10-CM | POA: Diagnosis not present

## 2023-08-09 HISTORY — PX: UMBILICAL HERNIA REPAIR: SHX196

## 2023-08-09 SURGERY — REPAIR, HERNIA, UMBILICAL, LAPAROSCOPIC
Anesthesia: General | Site: Abdomen

## 2023-08-09 MED ORDER — FENTANYL CITRATE (PF) 100 MCG/2ML IJ SOLN
25.0000 ug | INTRAMUSCULAR | Status: DC | PRN
  Administered 2023-08-09 (×3): 50 ug via INTRAVENOUS

## 2023-08-09 MED ORDER — ACETAMINOPHEN 500 MG PO TABS
ORAL_TABLET | ORAL | Status: AC
Start: 2023-08-09 — End: 2023-08-09
  Administered 2023-08-09: 1000 mg via ORAL
  Filled 2023-08-09: qty 2

## 2023-08-09 MED ORDER — ROCURONIUM BROMIDE 10 MG/ML (PF) SYRINGE
PREFILLED_SYRINGE | INTRAVENOUS | Status: DC | PRN
  Administered 2023-08-09: 50 mg via INTRAVENOUS

## 2023-08-09 MED ORDER — ONDANSETRON HCL 4 MG/2ML IJ SOLN
INTRAMUSCULAR | Status: AC
  Filled 2023-08-09: qty 2

## 2023-08-09 MED ORDER — FENTANYL CITRATE (PF) 100 MCG/2ML IJ SOLN
INTRAMUSCULAR | Status: AC
  Filled 2023-08-09: qty 2

## 2023-08-09 MED ORDER — AMISULPRIDE (ANTIEMETIC) 5 MG/2ML IV SOLN: 10.0000 mg | Freq: Once | INTRAVENOUS | Status: DC | PRN

## 2023-08-09 MED ORDER — SUGAMMADEX SODIUM 200 MG/2ML IV SOLN
INTRAVENOUS | Status: DC | PRN
  Administered 2023-08-09: 200 mg via INTRAVENOUS

## 2023-08-09 MED ORDER — TRAMADOL HCL 50 MG PO TABS: 50.0000 mg | ORAL_TABLET | Freq: Four times a day (QID) | ORAL | 0 refills | Status: DC | PRN

## 2023-08-09 MED ORDER — FENTANYL CITRATE (PF) 250 MCG/5ML IJ SOLN
INTRAMUSCULAR | Status: DC | PRN
  Administered 2023-08-09 (×3): 50 ug via INTRAVENOUS
  Administered 2023-08-09: 100 ug via INTRAVENOUS

## 2023-08-09 MED ORDER — CEFAZOLIN SODIUM-DEXTROSE 2-4 GM/100ML-% IV SOLN
INTRAVENOUS | Status: AC
  Filled 2023-08-09: qty 100

## 2023-08-09 MED ORDER — OXYCODONE HCL 5 MG/5ML PO SOLN
ORAL | Status: AC
  Filled 2023-08-09: qty 5

## 2023-08-09 MED ORDER — PHENYLEPHRINE 80 MCG/ML (10ML) SYRINGE FOR IV PUSH (FOR BLOOD PRESSURE SUPPORT)
PREFILLED_SYRINGE | INTRAVENOUS | Status: DC | PRN
  Administered 2023-08-09: 160 ug via INTRAVENOUS
  Administered 2023-08-09: 80 ug via INTRAVENOUS
  Administered 2023-08-09: 120 ug via INTRAVENOUS
  Administered 2023-08-09: 240 ug via INTRAVENOUS
  Administered 2023-08-09: 80 ug via INTRAVENOUS

## 2023-08-09 MED ORDER — ORAL CARE MOUTH RINSE: 15.0000 mL | Freq: Once | OROMUCOSAL | Status: AC

## 2023-08-09 MED ORDER — ACETAMINOPHEN 500 MG PO TABS
1000.0000 mg | ORAL_TABLET | ORAL | Status: AC
Start: 2023-08-09 — End: 2023-08-09

## 2023-08-09 MED ORDER — OXYCODONE HCL 5 MG/5ML PO SOLN
5.0000 mg | Freq: Once | ORAL | Status: AC | PRN
  Administered 2023-08-09: 5 mg via ORAL

## 2023-08-09 MED ORDER — BUPIVACAINE HCL (PF) 0.25 % IJ SOLN
INTRAMUSCULAR | Status: AC
  Filled 2023-08-09: qty 30

## 2023-08-09 MED ORDER — CHLORHEXIDINE GLUCONATE CLOTH 2 % EX PADS: 6.0000 | MEDICATED_PAD | Freq: Once | CUTANEOUS | Status: DC

## 2023-08-09 MED ORDER — 0.9 % SODIUM CHLORIDE (POUR BTL) OPTIME
TOPICAL | Status: DC | PRN
  Administered 2023-08-09: 1000 mL

## 2023-08-09 MED ORDER — CEFAZOLIN SODIUM-DEXTROSE 2-4 GM/100ML-% IV SOLN
2.0000 g | INTRAVENOUS | Status: AC
Start: 2023-08-09 — End: 2023-08-09
  Administered 2023-08-09: 2 g via INTRAVENOUS

## 2023-08-09 MED ORDER — PROPOFOL 10 MG/ML IV BOLUS
INTRAVENOUS | Status: AC
  Filled 2023-08-09: qty 20

## 2023-08-09 MED ORDER — LACTATED RINGERS IV SOLN: INTRAVENOUS | Status: DC

## 2023-08-09 MED ORDER — BUPIVACAINE-EPINEPHRINE (PF) 0.25% -1:200000 IJ SOLN
INTRAMUSCULAR | Status: AC
  Filled 2023-08-09: qty 30

## 2023-08-09 MED ORDER — CHLORHEXIDINE GLUCONATE 0.12 % MT SOLN
OROMUCOSAL | Status: AC
  Administered 2023-08-09: 15 mL via OROMUCOSAL
  Filled 2023-08-09: qty 15

## 2023-08-09 MED ORDER — ONDANSETRON HCL 4 MG/2ML IJ SOLN
INTRAMUSCULAR | Status: DC | PRN
  Administered 2023-08-09: 4 mg via INTRAVENOUS

## 2023-08-09 MED ORDER — MIDAZOLAM HCL 2 MG/2ML IJ SOLN
INTRAMUSCULAR | Status: AC
  Filled 2023-08-09: qty 2

## 2023-08-09 MED ORDER — CHLORHEXIDINE GLUCONATE 0.12 % MT SOLN: 15.0000 mL | Freq: Once | OROMUCOSAL | Status: AC

## 2023-08-09 MED ORDER — LIDOCAINE 2% (20 MG/ML) 5 ML SYRINGE
INTRAMUSCULAR | Status: AC
  Filled 2023-08-09: qty 5

## 2023-08-09 MED ORDER — PHENYLEPHRINE 80 MCG/ML (10ML) SYRINGE FOR IV PUSH (FOR BLOOD PRESSURE SUPPORT)
PREFILLED_SYRINGE | INTRAVENOUS | Status: AC
  Filled 2023-08-09: qty 10

## 2023-08-09 MED ORDER — LIDOCAINE 2% (20 MG/ML) 5 ML SYRINGE
INTRAMUSCULAR | Status: DC | PRN
  Administered 2023-08-09: 60 mg via INTRAVENOUS

## 2023-08-09 MED ORDER — PROPOFOL 10 MG/ML IV BOLUS
INTRAVENOUS | Status: DC | PRN
  Administered 2023-08-09: 200 mg via INTRAVENOUS

## 2023-08-09 MED ORDER — FENTANYL CITRATE (PF) 250 MCG/5ML IJ SOLN
INTRAMUSCULAR | Status: AC
  Filled 2023-08-09: qty 5

## 2023-08-09 MED ORDER — SUGAMMADEX SODIUM 200 MG/2ML IV SOLN
INTRAVENOUS | Status: AC
  Filled 2023-08-09: qty 2

## 2023-08-09 MED ORDER — ROCURONIUM BROMIDE 10 MG/ML (PF) SYRINGE
PREFILLED_SYRINGE | INTRAVENOUS | Status: AC
  Filled 2023-08-09: qty 10

## 2023-08-09 MED ORDER — BUPIVACAINE-EPINEPHRINE (PF) 0.5% -1:200000 IJ SOLN
INTRAMUSCULAR | Status: DC | PRN
Start: 2023-08-09 — End: 2023-08-09
  Administered 2023-08-09: 10 mL

## 2023-08-09 MED ORDER — OXYCODONE HCL 5 MG PO TABS: 5.0000 mg | ORAL_TABLET | Freq: Once | ORAL | Status: AC | PRN

## 2023-08-09 SURGICAL SUPPLY — 41 items
BAG COUNTER SPONGE SURGICOUNT (BAG) ×1 IMPLANT
BLADE CLIPPER SURG (BLADE) IMPLANT
CANISTER SUCT 3000ML PPV (MISCELLANEOUS) IMPLANT
CHLORAPREP W/TINT 26 (MISCELLANEOUS) ×1 IMPLANT
COVER SURGICAL LIGHT HANDLE (MISCELLANEOUS) ×1 IMPLANT
DERMABOND ADVANCED .7 DNX12 (GAUZE/BANDAGES/DRESSINGS) ×1 IMPLANT
DEVICE SECURE STRAP 25 ABSORB (INSTRUMENTS) ×1 IMPLANT
DEVICE TROCAR PUNCTURE CLOSURE (ENDOMECHANICALS) ×1 IMPLANT
ELECT REM PT RETURN 9FT ADLT (ELECTROSURGICAL) ×1 IMPLANT
ELECTRODE REM PT RTRN 9FT ADLT (ELECTROSURGICAL) ×1 IMPLANT
GLOVE BIO SURGEON STRL SZ7.5 (GLOVE) ×2 IMPLANT
GOWN STRL REUS W/ TWL LRG LVL3 (GOWN DISPOSABLE) ×2 IMPLANT
GOWN STRL REUS W/ TWL XL LVL3 (GOWN DISPOSABLE) ×1 IMPLANT
IRRIG SUCT STRYKERFLOW 2 WTIP (MISCELLANEOUS) IMPLANT
IRRIGATION SUCT STRKRFLW 2 WTP (MISCELLANEOUS) IMPLANT
KIT BASIN OR (CUSTOM PROCEDURE TRAY) ×1 IMPLANT
KIT TURNOVER KIT B (KITS) ×1 IMPLANT
MARKER SKIN DUAL TIP RULER LAB (MISCELLANEOUS) ×1 IMPLANT
MESH VENTRALIGHT ST 4X6IN (Mesh General) IMPLANT
NDL INSUFFLATION 14GA 120MM (NEEDLE) ×1 IMPLANT
NDL SPNL 22GX3.5 QUINCKE BK (NEEDLE) IMPLANT
NEEDLE INSUFFLATION 14GA 120MM (NEEDLE) ×1 IMPLANT
NEEDLE SPNL 22GX3.5 QUINCKE BK (NEEDLE) IMPLANT
NS IRRIG 1000ML POUR BTL (IV SOLUTION) ×1 IMPLANT
PAD ARMBOARD 7.5X6 YLW CONV (MISCELLANEOUS) ×2 IMPLANT
SCISSORS LAP 5X35 DISP (ENDOMECHANICALS) ×1 IMPLANT
SET TUBE SMOKE EVAC HIGH FLOW (TUBING) ×1 IMPLANT
SLEEVE Z-THREAD 5X100MM (TROCAR) ×1 IMPLANT
SUT CHROMIC 2 0 SH (SUTURE) ×1 IMPLANT
SUT ETHIBOND 0 MO6 C/R (SUTURE) IMPLANT
SUT MNCRL AB 4-0 PS2 18 (SUTURE) ×1 IMPLANT
SUT NOVA 1 T20/GS 25DT (SUTURE) IMPLANT
SUT PROLENE 2 0 KS (SUTURE) ×2 IMPLANT
TOWEL GREEN STERILE (TOWEL DISPOSABLE) ×1 IMPLANT
TOWEL GREEN STERILE FF (TOWEL DISPOSABLE) ×1 IMPLANT
TRAY LAPAROSCOPIC MC (CUSTOM PROCEDURE TRAY) ×1 IMPLANT
TROCAR 11X100 Z THREAD (TROCAR) IMPLANT
TROCAR BALLN 12MMX100 BLUNT (TROCAR) IMPLANT
TROCAR Z-THREAD OPTICAL 5X100M (TROCAR) ×1 IMPLANT
WARMER LAPAROSCOPE (MISCELLANEOUS) ×1 IMPLANT
WATER STERILE IRR 1000ML POUR (IV SOLUTION) ×1 IMPLANT

## 2023-08-09 NOTE — H&P (Signed)
 Chief Complaint: Hernia     History of Present Illness: Kevin Medina is a 53 y.o. male who is seen today as an office consultation at the request of Dr. Durene Cal for evaluation of Hernia .    Patient is a 53 year old male, with history of mitral valve replacement, comes in secondary to an umbilical hernia.  He states this been there for multiple years.  He states that over the last several months he has noticed that he had more pain and discomfort to the area with weight lifting.  He states he is able to reduce it on his own.  He had no previous abdominal surgery.  Patient sees Dr. Augustina Mood after his heart valve replacement.     Review of Systems: A complete review of systems was obtained from the patient.  I have reviewed this information and discussed as appropriate with the patient.  See HPI as well for other ROS.  Review of Systems  Constitutional:  Negative for fever.  HENT:  Negative for congestion.   Eyes:  Negative for blurred vision.  Respiratory:  Negative for cough, shortness of breath and wheezing.   Cardiovascular:  Negative for chest pain and palpitations.  Gastrointestinal:  Negative for heartburn.  Genitourinary:  Negative for dysuria.  Musculoskeletal:  Negative for myalgias.  Skin:  Negative for rash.  Neurological:  Negative for dizziness and headaches.  Psychiatric/Behavioral:  Negative for depression and suicidal ideas.   All other systems reviewed and are negative.     Medical History: Past Medical History:  Diagnosis Date   Dyslipidemia    Exercise-induced asthma (HHS-HCC)    MVP (mitral valve prolapse)    Non-rheumatic mitral regurgitation     Patient Active Problem List  Diagnosis   Mitral valve prolapse   Exercise-induced asthma (HHS-HCC)   Hyperlipidemia   Mitral regurgitation   Status post mitral valve repair   Acute postoperative pain   Postoperative anemia   Leukocytosis   S/P MVR (mitral valve repair)    Past Surgical  History:  Procedure Laterality Date   EXPLORATION CHEST FOR POSTOPERATIVE THROMBOSIS/INFECTION THORACOTOMY Right 04/21/2021   Procedure: EXPLORATION CHEST FOR POSTOPERATIVE HEMORRHAGE, THROMBOSIS OR INFECTION THORACOTOMY;  Surgeon: Collene Schlichter, MD;  Location: [facility];  Service: Cardiothoracic;  Laterality: Right;   REPAIR MITRAL VALVE VIA HEARTPORT N/A 04/21/2021   Procedure: ADULT, VALVULOPLASTY, MITRAL VALVE, VIA HEARTPORT, WITH CARDIOPULMONARY BYPASS; RADICALRECONSTRUCTION, WITH OR WITHOUT RING;  Surgeon: Collene Schlichter, MD;  Location: [facility];  Service: Cardiothoracic;  Laterality: N/A;   REPLACEMENT MITRAL VALVE VIA HEART PORT N/A 04/21/2021   Procedure: ADULT, REPLACEMENT, MITRAL VALVE, VIA HEARTPORT, WITH CARDIOPULMONARY BYPASS, possible;  Surgeon: Collene Schlichter, MD;  Location: [facility];  Service: Cardiothoracic;  Laterality: N/A;     Allergies  Allergen Reactions   Adhesive Tape-Silicones Itching and Rash    Current Outpatient Medications on File Prior to Visit  Medication Sig Dispense Refill   aspirin 81 MG EC tablet Take 81 mg by mouth once daily     acetaminophen (TYLENOL) 650 MG ER tablet Take 2 tablets (1,300 mg total) by mouth every 8 (eight) hours as needed for Pain     metoprolol tartrate (LOPRESSOR) 25 MG tablet Take 0.5 tablets (12.5 mg total) by mouth every 12 (twelve) hours 30 tablet 2   polyethylene glycol (MIRALAX) packet Take 1 packet (17 g total) by mouth once daily as needed for Constipation Mix in 4-8ounces of fluid prior to taking.  0   rosuvastatin (CRESTOR) 10 MG tablet Take 1 tablet (10 mg total) by mouth once daily (Patient not taking: Reported on 05/12/2021) 30 tablet 2   sennosides-docusate (SENOKOT-S) 8.6-50 mg tablet Take 2 tablets by mouth 2 (two) times daily (Patient taking differently: Take 2 tablets by mouth as needed)  0   No current facility-administered medications on file prior to visit.    Family  History  Problem Relation Age of Onset   Breast cancer Mother    Lung cancer Maternal Grandmother    Stroke Maternal Grandfather      Social History   Tobacco Use  Smoking Status Former   Current packs/day: 0.00   Average packs/day: 2.0 packs/day for 16.0 years (32.0 ttl pk-yrs)   Types: Cigarettes   Start date: 59   Quit date: 1999   Years since quitting: 25.9  Smokeless Tobacco Never     Social History   Socioeconomic History   Marital status: Married  Tobacco Use   Smoking status: Former    Current packs/day: 0.00    Average packs/day: 2.0 packs/day for 16.0 years (32.0 ttl pk-yrs)    Types: Cigarettes    Start date: 75    Quit date: 1999    Years since quitting: 25.9   Smokeless tobacco: Never  Vaping Use   Vaping status: Never Used  Substance and Sexual Activity   Alcohol use: Not Currently   Social Drivers of Health   Financial Resource Strain: Low Risk  (05/02/2021)   Overall Financial Resource Strain (CARDIA)    Difficulty of Paying Living Expenses: Not hard at all  Food Insecurity: No Food Insecurity (05/02/2021)   Hunger Vital Sign    Worried About Running Out of Food in the Last Year: Never true    Ran Out of Food in the Last Year: Never true  Transportation Needs: No Transportation Needs (05/02/2021)   PRAPARE - Administrator, Civil Service (Medical): No    Lack of Transportation (Non-Medical): No  Physical Activity: Sufficiently Active (05/24/2023)   Received [facility]   Exercise Vital Sign    Days of Exercise per Week: 7 days    Minutes of Exercise per Session: 60 min    Objective:    Vitals:   06/04/23 0909  BP: 118/80  Pulse: 93  Temp: 36.7 C (98 F)  SpO2: 99%  Weight: 77.9 kg (171 lb 12.8 oz)  Height: 165.1 cm (5\' 5" )  PainSc: 1   PainLoc: Abdomen    Body mass index is 28.59 kg/m.  Physical Exam Constitutional:      General: He is not in acute distress.    Appearance: Normal appearance.  HENT:      Head: Normocephalic.     Nose: No rhinorrhea.     Mouth/Throat:     Mouth: Mucous membranes are moist.     Pharynx: Oropharynx is clear.  Eyes:     General: No scleral icterus.    Pupils: Pupils are equal, round, and reactive to light.  Cardiovascular:     Rate and Rhythm: Normal rate.     Pulses: Normal pulses.  Pulmonary:     Effort: Pulmonary effort is normal. No respiratory distress.     Breath sounds: No stridor. No wheezing.  Abdominal:     General: Abdomen is flat. There is no distension.     Tenderness: There is no abdominal tenderness. There is no guarding or rebound.     Hernia: A hernia  is present. Hernia is present in the umbilical area.  Musculoskeletal:        General: Normal range of motion.     Cervical back: Normal range of motion and neck supple.  Skin:    General: Skin is warm and dry.     Capillary Refill: Capillary refill takes less than 2 seconds.     Coloration: Skin is not jaundiced.  Neurological:     General: No focal deficit present.     Mental Status: He is alert and oriented to person, place, and time. Mental status is at baseline.  Psychiatric:        Mood and Affect: Mood normal.        Thought Content: Thought content normal.        Judgment: Judgment normal.      Hernia Size: 2 cm Incarcerated: No Initial Hernia   Assessment and Plan:  Diagnoses and all orders for this visit:  Umbilical hernia without obstruction or gangrene    Kevin Medina is a 53 y.o. male    We will proceed to the OR for a lap ventral hernia repair with mesh. All risks and benefits were discussed with the patient, to generally include infection, bleeding, damage to surrounding structures, acute and chronic nerve pain, and recurrence. Alternatives were offered and described.  All questions were answered and the patient voiced understanding of the procedure and wishes to proceed at this point.       No follow-ups on file.  Axel Filler, MD, [facility], [place] [facility]

## 2023-08-09 NOTE — Op Note (Signed)
 08/09/2023  12:44 PM  PATIENT:  Kevin Medina  53 y.o. male  PRE-OPERATIVE DIAGNOSIS:  umbilical hernia  POST-OPERATIVE DIAGNOSIS:  1.5cm umblical hernia  PROCEDURE:  Procedure(s): LAPAROSCOPIC UMBILICAL HERNIA REPAIR WITH MESH (N/A)  SURGEON:  Surgeons and Role:    Axel Filler, MD - Primary  ANESTHESIA:   local and general  EBL:  minimal   BLOOD ADMINISTERED:none  DRAINS: none   LOCAL MEDICATIONS USED:  BUPIVICAINE   SPECIMEN:  No Specimen  DISPOSITION OF SPECIMEN:  N/A  COUNTS:  YES  TOURNIQUET:  * No tourniquets in log *  DICTATION: .Dragon Dictation  Findings: 1.5cm umb hernia  Details of the procedure:  After the patient was consented patient was taken back to the operating room patient was then placed in supine position bilateral SCDs in place.  The patient was prepped and draped in the usual sterile fashion. After antibiotics were confirmed a timeout was called and all facts were verified. The Veress needle technique was used to insuflate the abdomen at Palmer's point. The abdomen was insufflated to 14 mm mercury. Subsequently a 5 mm trocar was placed a camera inserted there was no injury to any intra-abdominal organs.    There was seen to be an non-incarcerated  umbilcal hernia.  A second camera port was in placed into the left lower quadrant.   At this the Falicform ligament was taken down with Bovie cautery maintaining hemostasis.  I proceeded to reduce the hernia contents.  The hernia sac was dissected out of the hernia and disposed.  The fascia at the hernia was reapproximated using a #0 Ethibonds x 2.  Once the hernia was cleared away, a Bard Ventralight 10x15cm  mesh was inserted into the abdomen.  The mesh was secured circumferentially with am Securestrap tacker in a double crown fashion.     The omentum was brought over the area of the mesh. The fascia at the left lower quadrant port site was reapproximated with a 0 vicryl and an endoclose  device.  The pneumoperitoneum was evacuated & all trocars  were removed. The skin was reapproximated with 4-0  Monocryl sutures in a subcuticular fashion. The skin was dressed with Dermabond.  The patient was taken to the recovery room in stable condition.  Type of repair -primary suture & mesh  Mesh overlap - 4cm  Placement of mesh -  beneath fascia and into peritoneal cavity  Size: 3cm, Primary Hernia, and Reducible Hernia   PLAN OF CARE: Discharge to home after PACU  PATIENT DISPOSITION:  PACU - hemodynamically stable.   Delay start of Pharmacological VTE agent (>24hrs) due to surgical blood loss or risk of bleeding: not applicable

## 2023-08-09 NOTE — Transfer of Care (Signed)
 Immediate Anesthesia Transfer of Care Note  Patient: Kevin Medina  Procedure(s) Performed: LAPAROSCOPIC UMBILICAL HERNIA REPAIR WITH MESH  Patient Location: PACU  Anesthesia Type:General  Level of Consciousness: awake and alert   Airway & Oxygen Therapy: Patient Spontanous Breathing and Patient connected to nasal cannula oxygen  Post-op Assessment: Report given to RN and Post -op Vital signs reviewed and stable  Post vital signs: Reviewed and stable  Last Vitals:  Vitals Value Taken Time  BP 155/86 08/09/23 1300  Temp 37.1 C 08/09/23 1258  Pulse 76 08/09/23 1303  Resp 15 08/09/23 1303  SpO2 99 % 08/09/23 1303  Vitals shown include unfiled device data.  Last Pain:  Vitals:   08/09/23 1101  TempSrc:   PainSc: 0-No pain         Complications: No notable events documented.

## 2023-08-09 NOTE — Anesthesia Procedure Notes (Signed)
 Procedure Name: Intubation Date/Time: 08/09/2023 12:01 PM  Performed by: Einar Grad, CRNAPre-anesthesia Checklist: Patient identified, Emergency Drugs available, Suction available, Patient being monitored and Timeout performed Patient Re-evaluated:Patient Re-evaluated prior to induction Oxygen Delivery Method: Circle system utilized Preoxygenation: Pre-oxygenation with 100% oxygen Induction Type: IV induction Ventilation: Mask ventilation without difficulty Laryngoscope Size: Mac and 4 Grade View: Grade I Tube type: Oral Tube size: 7.5 mm Secured at: 22 cm

## 2023-08-09 NOTE — Discharge Instructions (Signed)

## 2023-08-10 ENCOUNTER — Encounter (HOSPITAL_COMMUNITY): Payer: Self-pay | Admitting: General Surgery

## 2023-08-10 NOTE — Anesthesia Postprocedure Evaluation (Signed)
 Anesthesia Post Note  Patient: Kevin Medina  Procedure(s) Performed: LAPAROSCOPIC UMBILICAL HERNIA REPAIR WITH MESH (Abdomen)     Patient location during evaluation: PACU Anesthesia Type: General Level of consciousness: awake and alert Pain management: pain level controlled Vital Signs Assessment: post-procedure vital signs reviewed and stable Respiratory status: spontaneous breathing, nonlabored ventilation, respiratory function stable and patient connected to nasal cannula oxygen Cardiovascular status: blood pressure returned to baseline and stable Postop Assessment: no apparent nausea or vomiting Anesthetic complications: no   No notable events documented.  Last Vitals:  Vitals:   08/09/23 1330 08/09/23 1345  BP: (!) 150/95 (!) 149/97  Pulse: 76 73  Resp: 14 15  Temp:  36.5 C  SpO2: 98% 97%    Last Pain:  Vitals:   08/09/23 1345  TempSrc:   PainSc: 3                  Drago Hammonds S

## 2023-08-30 DIAGNOSIS — Z8719 Personal history of other diseases of the digestive system: Secondary | ICD-10-CM | POA: Diagnosis not present

## 2023-08-30 DIAGNOSIS — Z9889 Other specified postprocedural states: Secondary | ICD-10-CM | POA: Diagnosis not present

## 2023-09-24 DIAGNOSIS — Z0184 Encounter for antibody response examination: Secondary | ICD-10-CM | POA: Diagnosis not present

## 2024-05-24 ENCOUNTER — Ambulatory Visit (HOSPITAL_COMMUNITY): Admission: RE | Admit: 2024-05-24 | Discharge: 2024-05-24 | Attending: Internal Medicine | Admitting: Internal Medicine

## 2024-05-24 DIAGNOSIS — Z952 Presence of prosthetic heart valve: Secondary | ICD-10-CM | POA: Insufficient documentation

## 2024-05-24 DIAGNOSIS — I340 Nonrheumatic mitral (valve) insufficiency: Secondary | ICD-10-CM | POA: Diagnosis not present

## 2024-05-24 DIAGNOSIS — I5032 Chronic diastolic (congestive) heart failure: Secondary | ICD-10-CM | POA: Insufficient documentation

## 2024-05-24 DIAGNOSIS — I77810 Thoracic aortic ectasia: Secondary | ICD-10-CM | POA: Diagnosis not present

## 2024-05-24 DIAGNOSIS — E785 Hyperlipidemia, unspecified: Secondary | ICD-10-CM | POA: Diagnosis not present

## 2024-05-24 LAB — ECHOCARDIOGRAM COMPLETE
Area-P 1/2: 2.91 cm2
S' Lateral: 3.1 cm

## 2024-05-25 ENCOUNTER — Encounter (HOSPITAL_COMMUNITY): Payer: Self-pay | Admitting: Internal Medicine

## 2024-05-25 ENCOUNTER — Ambulatory Visit (HOSPITAL_COMMUNITY)
Admission: RE | Admit: 2024-05-25 | Discharge: 2024-05-25 | Disposition: A | Discharge status: Home / Self Care | Source: Ambulatory Visit | Attending: Internal Medicine | Admitting: Internal Medicine

## 2024-05-25 DIAGNOSIS — I341 Nonrheumatic mitral (valve) prolapse: Secondary | ICD-10-CM | POA: Diagnosis not present

## 2024-05-25 DIAGNOSIS — I5032 Chronic diastolic (congestive) heart failure: Secondary | ICD-10-CM

## 2024-05-25 DIAGNOSIS — Z952 Presence of prosthetic heart valve: Secondary | ICD-10-CM | POA: Diagnosis not present

## 2024-05-25 DIAGNOSIS — R002 Palpitations: Secondary | ICD-10-CM

## 2024-05-25 DIAGNOSIS — E54 Ascorbic acid deficiency: Secondary | ICD-10-CM | POA: Diagnosis not present

## 2024-05-25 DIAGNOSIS — I340 Nonrheumatic mitral (valve) insufficiency: Secondary | ICD-10-CM

## 2024-05-25 DIAGNOSIS — I471 Supraventricular tachycardia, unspecified: Secondary | ICD-10-CM | POA: Diagnosis not present

## 2024-05-25 DIAGNOSIS — I428 Other cardiomyopathies: Secondary | ICD-10-CM | POA: Diagnosis not present

## 2024-05-25 DIAGNOSIS — R079 Chest pain, unspecified: Secondary | ICD-10-CM | POA: Diagnosis not present

## 2024-05-25 DIAGNOSIS — Z8616 Personal history of COVID-19: Secondary | ICD-10-CM | POA: Diagnosis not present

## 2024-05-25 NOTE — Progress Notes (Signed)
 ADVANCED [facility] CLINIC NOTE  PCP: [employer], NP Primary Cardiologist: [facility] HF Cardiologist: Dr. Cherrie  CC: HF follow up  HPI:  Kevin Medina is a 53 y.o. male with HL, mitral valve prolapse and mitral regurgitation.   First noted to have MVP ~15 years ago when he underwent an echo due to palpitations. He was told at the time that the valve is not leaking much and he did not need surgical intervention. Developed COVID-19 infection in October 2020.  After his recovery he experienced significant exertional shortness of breath with initial attempt to return to exercise. Echo in 2021 EF 40%  with bileaflet MVP with at least moderate MR and EF 40%  TEE confirmed the presence of bileaflet prolapse with moderate to severe MR.   LVEF 40 to 45%. RV normal. L/R cath -> No CAD and normal right heart pressures.    Seen by Dr. Dusty and scheduled for MVR on 11/22/19. However initial intra-op TEE showed minimal MR so surgery was cancelled. Subsequently had CPX test and stress echo. CPX showed excellent functional capacity with mildly elevated VEVCO2 suggestive of increased pulmonary pressures with exercise. Stress echo with mod MR at rest and exercise. Subsequently underwent exercise RHC (as below) and MVR deferred due to only mild increase in PA pressures with exercise.   Seen back in Clinic 7/22 with recurrent DOE and palpitations. Echo 8/22 EF 60-65% Barlow's type valve mild to mod MVR. LA dilated. RV ok.  Zio (7/22) with occasional SVT. Repeat CPX 8/22 with excellent functional capacity but Ve/VCO2 slope 41 suggestive of significant increase in pulmonary pressures/dead space with exercise.   S/p MV Repair 04/21/21 w/ Dr. Ricky at [facility].  Echo 6/23 EF 40-45% mild to moderate MR with MV ring. Started on Jardiance  10.  Echo 12/23 EF 55-60% s/p MV repair. No MS. Mild MR   Echo 06/04/23 showed EF 50%, mild MR.   Echo 05/24/24: EF 55-60% MVR ok no MS. Mild MR  Today he returns for HF follow up. Exercising  routinely with walking, functional weightlifting and running intervals. Says if he pushes too hard his chest will hurt for a couple of days. Keeps his HR < 150.    ROS: All systems reviewed and negative except as per HPI.   Past Medical History:  Diagnosis Date   Allergy    Arrhythmia    all pres surgery   Heart murmur    due to MVP    Hyperlipidemia    Mitral regurgitation    Mitral valve prolapse    Current Outpatient Medications  Medication Sig Dispense Refill   aspirin  81 MG EC tablet Take 81 mg by mouth daily.     b complex vitamins capsule Take 1 capsule by mouth daily.     Cholecalciferol (VITAMIN D3) 50 MCG (2000 UT) capsule Take 2,000 Units by mouth daily.     Collagen-Vitamin C-Biotin (COLLAGEN PO) Take 1 Scoop by mouth daily.     Multiple Vitamin (MULTIVITAMIN) tablet Take 1 tablet by mouth daily.     Omega-3 Fatty Acids (FISH OIL PO) Take 4,500 mg by mouth daily.     Psyllium Husk POWD Take 1 Dose by mouth daily. 1 dose = 1 tablespoon     amoxicillin  (AMOXIL ) 500 MG capsule Take 4 capsules 1 hour prior to procedure. (Patient not taking: Reported on 05/25/2024) 4 capsule 0   No current facility-administered medications for this encounter.   Allergies  Allergen Reactions   Tape Itching and Rash  Social History   Socioeconomic History   Marital status: Married    Spouse name: Not on file   Number of children: 1   Years of education: Not on file   Highest education level: Not on file  Occupational History   Not on file  Tobacco Use   Smoking status: Former    Current packs/day: 0.00    Average packs/day: 2.0 packs/day for 16.0 years (32.0 ttl pk-yrs)    Types: Cigarettes    Start date: 06/15/1981    Quit date: 06/15/1997    Years since quitting: 26.9   Smokeless tobacco: Former    Types: Snuff    Quit date: 1992   Tobacco comments:    Briefly in 1990's  Vaping Use   Vaping status: Never Used  Substance and Sexual Activity   Alcohol use: Not Currently    Drug use: Never   Sexual activity: Yes    Partners: Female    Comment: wife only  Other Topics Concern   Not on file  Social History Narrative   Married. 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 [address]      Hobbies: exercise, time with family, enjoys being active   -busy with yardwork   Social Drivers of Health   Tobacco Use: Medium Risk (08/30/2023)   Received from [facility]   Patient History    Smoking Tobacco Use: Former    Smokeless Tobacco Use: Never    Passive Exposure: Not on file  Financial Resource Strain: Not on file  Food Insecurity: Not on file  Transportation Needs: Not on file  Physical Activity: Sufficiently Active (05/24/2023)   Exercise Vital Sign    Days of Exercise per Week: 7 days    Minutes of Exercise per Session: 60 min  Stress: Not on file  Social Connections: Not on file  Intimate Partner Violence: Not on file  Depression (PHQ2-9): Low Risk (05/24/2023)   Depression (PHQ2-9)    PHQ-2 Score: 0  Alcohol Screen: Not on file  Housing: Unknown (08/30/2023)   Received from [facility]   Epic    Unable to Pay for Housing in the Last Year: Not on file    Number of Times Moved in the Last Year: Not on file    At any time in the past 12 months, were you homeless or living in a shelter (including now)?: No  Utilities: Not on file  Health Literacy: Not on file   Family History  Problem Relation Age of Onset   Breast cancer Mother        survivor from 36s   Hyperlipidemia Father    Hypertension Father        passed at 46   Hernia Father        did not do well after surgery, was dealing with leukemia related state- had issues after needing transfusion   Prostate cancer Father        in 29s   Healthy Sister    Lung cancer Maternal Grandmother        nonsmoker age 73   Stroke Maternal Grandfather        in 50s   Other Paternal Grandmother         unknown history   Hyperlipidemia Paternal Grandfather        possible heart issues   Healthy Daughter    Colon polyps Maternal Uncle  passed of old age in 25s   Colon cancer Neg Hx    Esophageal cancer Neg Hx    Rectal cancer Neg Hx    Stomach cancer Neg Hx    BP 122/70   Pulse 64   Wt 77.7 kg (171 lb 6.4 oz)   SpO2 97%   BMI 28.52 kg/m   Wt Readings from Last 3 Encounters:  05/25/24 77.7 kg (171 lb 6.4 oz)  08/09/23 75.7 kg (166 lb 12.8 oz)  08/06/23 75.8 kg (167 lb)   PHYSICAL EXAM: General:  Sitting up No resp difficulty HEENT: normal Neck: supple. no JVD.  Cor: Regular rate & rhythm. No rubs, gallops or murmurs. Lungs: clear Abdomen: soft, nontender, nondistended.Good bowel sounds. Extremities: no cyanosis, clubbing, rash, edema Neuro: alert & orientedx3, cranial nerves grossly intact. moves all 4 extremities w/o difficulty. Affect pleasant   ECG (personally reviewed): NSR 69 +PAC No ST-T wave abnormalities. Personally reviewed   ASSESSMENT & PLAN: 1. 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 Ricky at [facility]  - Echo (12/05/21): EF 40-45% mild to moderate MR. - Echo 06/05/22: EF 55-60% mild MR. - Echo  06/04/23, EF 50%, mild MR. - Echo 05/24/24: EF 55-60% MVR ok no MS. Mild MR - Aware of SBE prophylaxis. We discussed again today   2. Non-ischemic CM - Echo 12/05/21 EF 40-45% mild to moderate MR - Echo 06/05/22: EF 55-60% mild MR - Echo 06/04/23 EF 50% - Echo 05/24/24: EF 55-60% MVR ok no MS. Mild MR - NYHA I Volume ok   3. CP - LHC 2021 with normal cors - ECG without acute SR-T changes - Suspect MSK, but will arrange ETT. Discussed with Dr. Cherrie  4. Palpitations - Zio (8/22) no high-grade arrhythmias. - Zio (7/22) with occasional SVT.  - Resolved    Toribio Cherrie, MD  05/25/2024

## 2024-05-25 NOTE — Addendum Note (Signed)
 Encounter addended by: Nova Evett B, RN on: 05/25/2024 11:13 AM  Actions taken: Clinical Note Signed

## 2024-05-25 NOTE — Patient Instructions (Signed)
°  Follow-Up in: 12 months with Dr. Bensimhon PLEASE CALL OUR OFFICE AROUND OCTOBER  TO GET SCHEDULED FOR YOUR APPOINTMENT. PHONE NUMBER IS [phone] OPTION 2   At the [facility], you and your health needs are our priority. We have a designated team specialized in the treatment of Heart Failure. This Care Team includes your primary Heart Failure Specialized Cardiologist (physician), Advanced Practice Providers (APPs- Physician Assistants and Nurse Practitioners), and Pharmacist who all work together to provide you with the care you need, when you need it.   You may see any of the following providers on your designated Care Team at your next follow up:  Dr. Toribio Fuel Dr. Ezra Shuck Dr. Odis Brownie Greig Mosses, NP Caffie Shed, [place] [facility] [place], [place] Beckey Coe, NP Jordan Lee, NP Tinnie Redman, PharmD   Please be sure to bring in all your medications bottles to every appointment.   Need to Contact Us :  If you have any questions or concerns before your next appointment please send us  a message through [place] or call our office at [phone].    TO LEAVE A MESSAGE FOR THE NURSE SELECT OPTION 2, PLEASE LEAVE A MESSAGE INCLUDING: YOUR NAME DATE OF BIRTH CALL BACK NUMBER REASON FOR CALL**this is important as we prioritize the call backs  YOU WILL RECEIVE A CALL BACK THE SAME DAY AS LONG AS YOU CALL BEFORE 4:00 PM  s

## 2024-06-02 ENCOUNTER — Encounter: Payer: Self-pay | Admitting: Family Medicine

## 2024-06-02 ENCOUNTER — Ambulatory Visit: Payer: BC Managed Care – PPO | Admitting: Family Medicine

## 2024-06-02 ENCOUNTER — Ambulatory Visit: Payer: Self-pay | Admitting: Family Medicine

## 2024-06-02 VITALS — BP 138/70 | HR 66 | Temp 98.3°F | Ht 65.0 in | Wt 169.8 lb

## 2024-06-02 DIAGNOSIS — Z125 Encounter for screening for malignant neoplasm of prostate: Secondary | ICD-10-CM

## 2024-06-02 DIAGNOSIS — Z1283 Encounter for screening for malignant neoplasm of skin: Secondary | ICD-10-CM | POA: Diagnosis not present

## 2024-06-02 DIAGNOSIS — Z131 Encounter for screening for diabetes mellitus: Secondary | ICD-10-CM | POA: Diagnosis not present

## 2024-06-02 DIAGNOSIS — E663 Overweight: Secondary | ICD-10-CM | POA: Diagnosis not present

## 2024-06-02 DIAGNOSIS — Z0000 Encounter for general adult medical examination without abnormal findings: Secondary | ICD-10-CM

## 2024-06-02 DIAGNOSIS — Z23 Encounter for immunization: Secondary | ICD-10-CM | POA: Diagnosis not present

## 2024-06-02 DIAGNOSIS — Z126 Encounter for screening for malignant neoplasm of bladder: Secondary | ICD-10-CM

## 2024-06-02 DIAGNOSIS — Z13220 Encounter for screening for lipoid disorders: Secondary | ICD-10-CM | POA: Diagnosis not present

## 2024-06-02 DIAGNOSIS — Z130 Encounter for screening for diseases of the blood and blood-forming organs and certain disorders involving the immune mechanism: Secondary | ICD-10-CM

## 2024-06-02 LAB — URINALYSIS, ROUTINE W REFLEX MICROSCOPIC
Bilirubin Urine: NEGATIVE
Hgb urine dipstick: NEGATIVE
Ketones, ur: NEGATIVE
Leukocytes,Ua: NEGATIVE
Nitrite: NEGATIVE
Specific Gravity, Urine: <=1.005 — AB (ref 1.000–1.030)
Total Protein, Urine: NEGATIVE
Urine Glucose: NEGATIVE
Urobilinogen, UA: 0.2 (ref 0.0–1.0)
pH: 6.5 (ref 5.0–8.0)

## 2024-06-02 LAB — COMPREHENSIVE METABOLIC PANEL WITH GFR
ALT: 28 U/L (ref 3–53)
AST: 24 U/L (ref 5–37)
Albumin: 4.7 g/dL (ref 3.5–5.2)
Alkaline Phosphatase: 38 U/L — ABNORMAL LOW (ref 39–117)
BUN: 14 mg/dL (ref 6–23)
CO2: 31 meq/L (ref 19–32)
Calcium: 9.6 mg/dL (ref 8.4–10.5)
Chloride: 99 meq/L (ref 96–112)
Creatinine, Ser: 1.2 mg/dL (ref 0.40–1.50)
GFR: 68.34 mL/min
Glucose, Bld: 78 mg/dL (ref 70–99)
Potassium: 4 meq/L (ref 3.5–5.1)
Sodium: 139 meq/L (ref 135–145)
Total Bilirubin: 1 mg/dL (ref 0.2–1.2)
Total Protein: 6.6 g/dL (ref 6.0–8.3)

## 2024-06-02 LAB — CBC WITH DIFFERENTIAL/PLATELET
Basophils Absolute: 0 K/uL (ref 0.0–0.1)
Basophils Relative: 0.7 % (ref 0.0–3.0)
Eosinophils Absolute: 0.1 K/uL (ref 0.0–0.7)
Eosinophils Relative: 0.9 % (ref 0.0–5.0)
HCT: 44.5 % (ref 39.0–52.0)
Hemoglobin: 15.1 g/dL (ref 13.0–17.0)
Lymphocytes Relative: 35.7 % (ref 12.0–46.0)
Lymphs Abs: 2.1 K/uL (ref 0.7–4.0)
MCHC: 33.9 g/dL (ref 30.0–36.0)
MCV: 91.6 fl (ref 78.0–100.0)
Monocytes Absolute: 0.4 K/uL (ref 0.1–1.0)
Monocytes Relative: 7.1 % (ref 3.0–12.0)
Neutro Abs: 3.3 K/uL (ref 1.4–7.7)
Neutrophils Relative %: 55.6 % (ref 43.0–77.0)
Platelets: 253 K/uL (ref 150.0–400.0)
RBC: 4.86 Mil/uL (ref 4.22–5.81)
RDW: 12.8 % (ref 11.5–15.5)
WBC: 5.9 K/uL (ref 4.0–10.5)

## 2024-06-02 LAB — PSA: PSA: 0.69 ng/mL (ref 0.10–4.00)

## 2024-06-02 LAB — LIPID PANEL
Cholesterol: 251 mg/dL — ABNORMAL HIGH (ref 28–200)
HDL: 56.6 mg/dL
LDL Cholesterol: 181 mg/dL — ABNORMAL HIGH (ref 10–99)
NonHDL: 194.69
Total CHOL/HDL Ratio: 4
Triglycerides: 67 mg/dL (ref 10.0–149.0)
VLDL: 13.4 mg/dL (ref 0.0–40.0)

## 2024-06-02 LAB — HEMOGLOBIN A1C: Hgb A1c MFr Bld: 5.4 % (ref 4.6–6.5)

## 2024-06-02 NOTE — Patient Instructions (Addendum)
 We have placed a referral for you today to dermatology - please call their # in 1 week [phone]  MMR vaccine today as you were not immune to rubella  Please stop by lab before you go If you have mychart- we will send your results within 3 business days of us  receiving them.  If you do not have mychart- we will call you about results within 5 business days of us  receiving them.  *please also note that you will see labs on mychart as soon as they post. I will later go in and write notes on them- will say notes from Dr. Katrinka   Recommended follow up: Return in about 1 year (around 06/02/2025) for physical or sooner if needed.Schedule b4 you leave.

## 2024-06-02 NOTE — Progress Notes (Signed)
 " Phone: [phone]   Subjective:  Patient presents today for their annual physical. Chief complaint-noted.   See problem oriented charting- ROS- full  review of systems was completed and negative  except for topics noted under acute/chronic concerns  The following were reviewed and entered/updated in epic: Past Medical History:  Diagnosis Date   Allergy    Arrhythmia    all pres surgery   Blood transfusion without reported diagnosis 04/21/2021   from surgery Mitral Valve repair   Heart murmur    due to MVP    Hyperlipidemia    Mitral regurgitation    Mitral valve prolapse    Patient Active Problem List   Diagnosis Date Noted   Mitral regurgitation     Priority: High   Mitral valve prolapse 07/14/2019   Past Surgical History:  Procedure Laterality Date   CARDIAC VALVE REPLACEMENT  11/712022   Repair not replacement   COLONOSCOPY     MITRAL VALVE REPAIR  04/21/2021   POLYPECTOMY  15 yrs ago    ? type of polyps per pt    RIGHT HEART CATH N/A 02/02/2020   Procedure: RIGHT HEART CATH;  Surgeon: Cherrie Toribio SAUNDERS, MD;  Location: [facility];  Service: Cardiovascular;  Laterality: N/A;   RIGHT/LEFT HEART CATH AND CORONARY ANGIOGRAPHY N/A 10/05/2019   Procedure: RIGHT/LEFT HEART CATH AND CORONARY ANGIOGRAPHY;  Surgeon: Court Dorn PARAS, MD;  Location: [facility];  Service: Cardiovascular;  Laterality: N/A;   TEE WITHOUT CARDIOVERSION N/A 10/05/2019   Procedure: TRANSESOPHAGEAL ECHOCARDIOGRAM (TEE);  Surgeon: Pietro Redell RAMAN, MD;  Location: [facility] ENDOSCOPY;  Service: Cardiovascular;  Laterality: N/A;   TEE WITHOUT CARDIOVERSION N/A 11/22/2019   Procedure: TRANSESOPHAGEAL ECHOCARDIOGRAM (TEE);  Surgeon: Dusty Sudie DEL, MD;  Location: [facility] OR;  Service: Open Heart Surgery;  Laterality: N/A;   UMBILICAL HERNIA REPAIR N/A 08/09/2023   Procedure: LAPAROSCOPIC UMBILICAL HERNIA REPAIR WITH MESH;  Surgeon: Rubin Calamity, MD;  Location: [facility] OR;  Service: General;   Laterality: N/A;   VASECTOMY      Family History  Problem Relation Age of Onset   Breast cancer Mother        survivor from 66s   Cancer Mother    Hyperlipidemia Father    Hypertension Father        passed at 67   Hernia Father        did not do well after surgery, was dealing with leukemia related state- had issues after needing transfusion   Prostate cancer Father        in 65s   Healthy Sister    Lung cancer Maternal Grandmother        nonsmoker age 27   Cancer Maternal Grandmother    Stroke Maternal Grandfather        in 64s   Other Paternal Grandmother        unknown history   Hyperlipidemia Paternal Grandfather        possible heart issues   Healthy Daughter    Colon polyps Maternal Uncle        passed of old age in 23s   Colon cancer Neg Hx    Esophageal cancer Neg Hx    Rectal cancer Neg Hx    Stomach cancer Neg Hx     Medications- reviewed and updated Current Outpatient Medications  Medication Sig Dispense Refill   aspirin  81 MG EC tablet Take 81 mg by mouth daily.     b complex vitamins  capsule Take 1 capsule by mouth daily.     Cholecalciferol (VITAMIN D3) 50 MCG (2000 UT) capsule Take 2,000 Units by mouth daily.     Collagen-Vitamin C-Biotin (COLLAGEN PO) Take 1 Scoop by mouth daily.     Multiple Vitamin (MULTIVITAMIN) tablet Take 1 tablet by mouth daily.     Omega-3 Fatty Acids (FISH OIL PO) Take 4,500 mg by mouth daily.     Psyllium Husk POWD Take 1 Dose by mouth daily. 1 dose = 1 tablespoon     amoxicillin  (AMOXIL ) 500 MG capsule Take 4 capsules 1 hour prior to procedure. (Patient not taking: Reported on 06/02/2024) 4 capsule 0   No current facility-administered medications for this visit.    Allergies-reviewed and updated Allergies[1]  Social History   Social History Narrative   Married. 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 Oropharynx normal Neck: no thyromegaly CV: RRR no murmurs rubs or gallops Lungs: CTAB no crackles, wheeze, rhonchi Abdomen: soft/nontender/nondistended/normal bowel sounds. No rebound or guarding.  Ext: no edema Skin: warm, dry Neuro: grossly normal, moves all extremities, PERRLA    Assessment and Plan  53 y.o. male presenting for annual physical.  Health Maintenance counseling: 1. Anticipatory guidance: Patient counseled regarding regular dental exams -q6 months, eye exams - every few yearly,  avoiding smoking and second hand smoke , limiting alcohol to 2 beverages per day - not since 2022 personal choice, no illicit drugs .   2. Risk factor reduction:  Advised patient of need for regular exercise and diet rich and fruits and vegetables to reduce risk of heart attack and stroke.  Exercise- still daily exercise- has to limit his intense exercise due to the heart.  Diet/weight management-within 3 lbs of last year- reasonable weight for muscle mass hed like to at least mantain.  Wt Readings from Last 3 Encounters:  06/02/24 169 lb 12.8 oz (77 kg)  05/25/24 171 lb 6.4 oz (77.7 kg)  08/09/23 166 lb 12.8 oz (75.7 kg)  3. Immunizations/screenings/ancillary studies- holding off on flu and COVID . Declines Prevnar. Not immune to rubella- we will update MMR vaccine today.  Immunization History  Administered Date(s) Administered   Janssen (J&J) SARS-COV-2 Vaccination 08/26/2019   PFIZER SARS-COV-2 Pediatric Vaccination 5-66yrs 05/24/2020   Tdap 07/14/2019   Zoster Recombinant(Shingrix) 07/19/2020, 01/04/2021   4. Prostate cancer screening-  dad with  prostate cancer history- we opted to screen yearly . Nocturia twice a night but stable Lab Results  Component Value Date   PSA 0.69 05/24/2023   PSA 0.70 08/29/2021   5. Colon cancer screening - 10/31/2021 with 7 year repeat due to precancerous polyp  6. Skin cancer screening- no dermatologist but will refer. advised regular sunscreen use. Denies worrisome, changing, or new skin lesions.  7. Smoking associated screening (lung cancer screening, AAA screen 65-75, UA)- former smoker- quit 1999. 32 pack years- started age 53. Will not need abdominal aortic  aneurysm- normal CT angio abdomen/pelvis on 11/20/19. Check UA  8. STD screening - only active with wife   Status of chronic or acute concerns   # Mitral valve prolapse and mitral regurgitation S: Issues date back at least 15 years but progressive issues after COVID in 2020-eventually led to the following from Dr. Nelle note Echo 8/22 EF 60-65% Barlow's type valve mild to mod MVR. LA dilated. RV ok.  Zio (7/22) with occasional SVT. Repeat CPX 8/22 with excellent functional capacity but Ve/VCO2 slope 41 suggestive of significant increase in pulmonary pressures/dead space with exercise.    S/p MVR 04/21/21 w/ Dr. Gaca at [facility].   Echo 6/23 EF 40-45% mild to moderate MR with MV ring. Started on Jardiance  10. -later stopped jardiance  as craved ice cream and gained weight -Most recent echocardiogram 06/04/2023 with 55 to 60% EF status post mitral valve repair with no mitral stenosis and mild to omoderate mitral regurgitation- DrRONITA Fuel thought likely an over-read   -Still has hypertensive response to exercise  -Requires SBE prophylaxis   -max 2 days a week with hard workouts and avoids heavy lifting after surgery. His strong drive has dropped. Still exercises but more of a chore and less of a joy. Does not have the social component and that's been hard.  A/P: overall stable- continue close follow up with cardiology   #umbilical Hernia  repair - Dr. Rubin in 2025 - doing well   #hyperlipidemia with 2021 catheterization with normal coronary arteries S: Medication: red yeast rice trial for last month -reassuring catheterization in the past  Lab Results  Component Value Date   CHOL 239 (H) 05/24/2023   HDL 53.20 05/24/2023   LDLCALC 168 (H) 05/24/2023   TRIG 86.0 05/24/2023   CHOLHDL 4 05/24/2023   A/P: lipids have been moderately high but interested to see how he does on red yeast rice plus reassuring catheterization in 2021.   # screen diabetes - update a1c  #Blood pressure high acceptable today but looked much better about a week ago BP Readings from Last 3 Encounters:  06/02/24 138/70  05/25/24 122/70  08/09/23 (!) 149/97   Recommended follow up: Return in about 1 year (around 06/02/2025) for physical or sooner if needed.Schedule b4 you leave.  Lab/Order associations: fasting   ICD-10-CM   1. Preventative health care  Z00.00     2. Screening for bladder cancer  Z12.6     3. Screening for prostate cancer  Z12.5     4. Screening for hyperlipidemia  Z13.220     5. Screening for deficiency anemia  Z13.0     6. Screening exam for skin cancer  Z12.83 Ambulatory referral to Dermatology     No orders of the defined types were placed in this encounter.  Return precautions advised.  Garnette Lukes, MD      [1]  Allergies Allergen Reactions   Tape Itching and Rash   "

## 2025-02-05 ENCOUNTER — Ambulatory Visit: Admitting: Physician Assistant

## 2025-06-04 ENCOUNTER — Encounter: Admitting: Family Medicine
